# Patient Record
Sex: Male | Born: 1990 | Hispanic: No | Marital: Single | Smoking: Never smoker
Health system: Southern US, Community
[De-identification: ages and names within clinical notes are randomized; demographics above are authoritative.]

---
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
## 2010-12-02 NOTE — H&P (Signed)
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  The patient  allegedly made threats at school to cut the throat, blow up the home,  and kill the animals of a male peer named Mike Juarez who called him  stupid.  The victim has a 50-C order of protection and the school is  considering charges while the patient states very little of this  actually happened, and he does not know why he is at the hospital.  The  patient was placed on a petition by Dr. Jacklyn Shell.  The  patient also reported suicidal ideation being considered dangerous to  himself as well.   HISTORY OF PRESENT ILLNESS:  The patient is under the outpatient care of  Dr. Dolores Frame in [facility] for outpatient psychiatric care.  However, family indicates Dr. Omelia Blackwater is out of network and seem to  suggest that they do not see him very often.  The patient indicates he  has no therapy and never has.  The patient was considered significantly  depressed with crying at [facility], though he was referred as having  mood disorder NOS being treated with Abilify 10 mg daily, Klonopin 0.5  mg daily, and Concerta 36 mg daily by Dr. Omelia Blackwater.  The patient reports  that he had been noncompliant with his medication, but restarted 2 weeks  ago.  The patient formulates that his symptoms do not  absolutely require  the medication, although he states the Concerta does help him  concentrate more.  However, he states he can concentrate adequately  without it.  He states he does sleep in class because he is bored,  though he cannot stay awake when they need to take notes.  The patient  suggests that sleeping in class bothers the cheerleader Mike Juarez as the  main reason for her complaints about him which she states are unfair.  He reports that the cheerleader told others that he wears spikes and all  black, has lacerations on his wrist, and is going to cut her throat and  drink her blood.  The patient cries at times and denies at others.  Mother considers him substantially depressed and states he is always  bringing the family down by his dissatisfaction and disappointment with  life in general, but also family life.  The patient has also reported  suicidal ideation dangerous to himself.  [facility]  indicates that the patient's threats were verified by teachers and peers  even though the patient denies making them.  The patient indicates that  he worries about things over and over though his rumination seems to be  more depressive than anxiety.  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  The patient's  anxiety seems more an explanation for his maladaptive and depressive  behaviors rather than vice versa.  The patient does not appear to have  as much primary anxiety historically, though he does have Klonopin 0.5  mg every morning.  The patient denies organic central nervous system  trauma.  He does not acknowledge hallucinations now or in the past.  He  seems hypersensitive to the comments or actions of others with impulse  control difficulties.  He acknowledges that his mood and anger problems  as well as his anxiety or like those of his biological father who has  recently had a second divorce and is having much  difficulty.  The  patient seems to identify with and share father's symptoms.  I cannot  determine that he sees father very often.  The patient concludes that he  will does have to transfer to [employer] now.  The patient is not  practical or good judgment as he makes such plans.  He has no other  mental health care and denies any primary care.  He uses no alcohol or  illicit drugs except that he smokes one fourth of pack per day of  cigarettes for 2 years.  He denies other alcohol or illicit drugs.  The  school has noted that mother picks ujp the  patient any time he has an  anger outburst, and the patient hangs out with a community residence  where variant behaviors and identities such as substance abuse may  become instilled in the patient.   PAST MEDICAL HISTORY:  The patient has scars on his chest and his left  upper extremity as well as tattoos.  He has a paint ball contusion with  ecchymosis on the abdomen.  He has a history of ventilation tubes  recurrent otitis media on four occasions in the past.  He has contact  lenses.  He had an elbow fracture at age 20.  He had sutures in his left  ankle at age 20.  He reports being sexually active.  He reports being  allergic or sensitive to the Lexapro, eggs and tea manifest by dizziness  and nausea.  He has had no seizure or syncope.  He has had no bulimic  purging or diarrhea.  He has had no heart murmur or arrhythmia.   REVIEW OF SYSTEMS:  The patient denies difficulty with gait, gaze or  continence.  He denies exposure to communicable disease or toxins.  He  denies rash, jaundice or purpura.  There is no headache or memory loss  currently.  There is no sensory loss or coordination deficit.  There is  no cough, dyspnea, tachypnea or wheeze.  There is no chest pain,  palpitations or presyncope.  There is no abdominal pain, nausea,  vomiting or diarrhea.  There is no dysuria or arthralgia.   IMMUNIZATIONS:  Up-to-date.    FAMILY HISTORY:  The patient resides with mother and stepfather who have  been together since the patient was 20 years of age.  Biological parents  divorced when the patient was 66 years of age.  The patient considers  stepfather helpful and appropriate.  The patient is only child in the  home.  He reports having step siblings in [place].  Father  resides in [place], and the patient describes father as having  worry, mood problems and easy anger.  Mother reports that father was  inpatient at age 55.  The patient reports father is undergoing a second  divorce.  Maternal aunt has bipolar disorder.  Paternal aunt, paternal  grandfather and two paternal uncles have ADD.  Cousins and paternal  uncles have substance abuse with cannabis.   SOCIAL DEVELOPMENTAL HISTORY:  The patient is a twelfth grade student at  [employer].  Mother reports grades were the worst ever  last school year.  The patient reports that Concerta helps him  concentrate, but that he can function just as well without it if he  tries.  The patient reportedly currently has a B's and C's for grades.  However he states he sleeps in class unless he needs to take note some  that he stays awake.  He thinks that his sleeping in class may possibly  upset the other students.  Apparently, there is one other potential  victim of his threats in the class besides a Landscape architect.  The  patient states that Willette Pa him of having spikes and all black  clothing, having cut his wrists, and having told her he would drink her  blood by cutting her neck.  The patient has had death charges that were  dropped in the past.  He wants to be a Network engineer.  He now wants to  transfer to [employer].  He had a restaurant job for  2 years that he quit reporting that he was having panic at work.  The  patient is worried that he may have some legal charges either from the  school or as a  consequence of the 50-C order of protection that the  victim obtained.   ASSETS:  The patient states that he can take notes and learn in school  and does want to graduate.   MENTAL STATUS EXAM:  Height is 71 inches and weight is 145 pounds.  Blood pressure is 131/84 with heart rate of 60 sitting and 136/77 with  heart rate of 58 standing.  He is right-handed.  The patient is alert  and oriented with speech intact.  Cranial nerves II-XII are intact.  Muscle strengths and tone are normal.  There are no pathologic reflexes  or soft neurologic findings.  There are no abnormal involuntary  movements.  Gait and gaze are intact.  The patient is defensive  particularly for his actions at school the day of admission.  The  patient maintains that he was falsely accused and that he should not be  at the hospital.  He denies that anything happened, but can formulate  what he thinks Mike Juarez said happened.  The patient did not act upon his  threats otherwise and the family reports that he does get angry in the  past but does not act on the threats.  The patient seems to identify  with biological father somewhat.  The patient had stopped his medicine  until 2 weeks ago and restarted but still has decompensated.  He has  transitional and developmental stressors nearly finished with high  school.  The patient tends to ruminate more than he seems to be over  anxious or obsessive-compulsive.  His sleeping in class does not appear  to be anxiety mediated.  He acknowledges boredom and atypical depressive  features and mother seems to reiterate the depressive features.  There  is no psychosis or mania evident.  He has suicidal ideation attached to  his more substantial homicide threats.   IMPRESSION:  AXIS I:  1. Mood disorder not otherwise specified, most consistent with      atypical major depression.  2. Oppositional defiant disorder.  3. 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  4. Other interpersonal problem.  5. Parent child problem.  6. Other specified family circumstances  7. Noncompliance with treatment.  AXIS II:  Diagnosis deferred.  AXIS III:  1. Sensitive to Lexapro, eggs and tea, manifested by dizziness and      nausea.  2. Contact lenses.  3. Contusion abdomen from paint ball.  4. Cigarette smoking.  AXIS IV:  Stressors family moderate to severe acute and chronic; school  extreme acute and chronic; phase of life moderate acute and chronic.  AXIS V:  GAF on admission 35 with highest in last year 68.   PLAN:  The patient is admitted for inpatient adolescent psychiatric and  multidisciplinary multimodal behavioral treatment in a team-based  programmatic locked psychiatric unit.  Abilify pharmacotherapy will be  continued at 10 mg every morning and can be titrated up if indicated.  Will hold Concerta at this time until mood and anger are definitely  stabilized, and the need is documented to resume Concerta.  Klonopin  will be discontinued.  Apparently Lexapro was prescribed in the past but  was not tolerated.  Cognitive behavioral therapy, anger management,  interpersonal therapy, social communication skill training, problem-  solving and coping skill training, habit reversal, social learning  strategies, family therapy, and empathy training therapies can be  undertaken.  Estimated length stay is 5-7 days with target symptoms  discharge being stabilization of suicide risk and mood, stabilization of  homicide risk and dangerous disruptive behavior, and generalization of  the capacity for safe effect participation in subsequent outpatient  treatment in school and the community.      Lalla Brothers, MD  Electronically Signed     GEJ/MEDQ  D:  05/30/2008  T:  05/30/2008  Job:  [id number]

## 2010-12-02 NOTE — Discharge Summary (Signed)
Mike Juarez, Mike Juarez              ACCOUNT NO.:  [id number]   MEDICAL RECORD NO.:  [id number]          PATIENT TYPE:  INP   LOCATION:  0201                          FACILITY:  BH   PHYSICIAN:  Lalla Brothers, MDDATE OF BIRTH:  23-Sep-1990   DATE OF ADMISSION:  05/29/2008  DATE OF DISCHARGE:  06/05/2008                               DISCHARGE SUMMARY   IDENTIFICATION:  23-1/21-year-old male 12th grade student at [employer], though he still has some eleventh grade classes,  is admitted emergently involuntarily on an [facility] petition for  commitment upon transfer from [facility] for inpatient  stabilization and treatment of homicide and suicide threats, depression  and dangerous disruptive behavior.  The patient allegedly made threats  to a male peer at school when she called him stupid, angrily  threatening to cut her throat, blow up her home and kill her animals.  The family of the girl obtained at 79 -C order of protection and the  patient was detained by Dr. Rubbie Battiest for suicidal ideation.  For full  details please see the typed admission assessment.   SYNOPSIS OF PRESENT ILLNESS:  The patient has apparently had  psychotherapy for one session and declined to continue.  He has been  under the outpatient treatment of Dr. Dolores Frame for the last year  receiving pharmacotherapies including currently Concerta 36 mg every  morning, Abilify 10 mg every morning, Klonopin 0.5 mg daily and  clonidine 0.1 mg nightly if needed.  The patient restarted medications 2  weeks ago after mother had allowed him to discontinue medications after  the last school year ended.  The patient's despair and distress have  again built up such that they fear he will start falling behind in  school.  He significantly failed last school year but has done much  better this school year academically.  His ADHD appears to have even  more social than academic  disorganization.  Although the patient has  denial on arrival stating he does not know why he is hospitalized and  did not do anything wrong, he offers no negative comments or retaliation  for the girl who has obtained an order of protection.  In fact he and  the family are shocked that such order of protection was filed and do  not understand the extent of retaliation by the girl or her family.  The  patient states he had ridden the school bus with this girl in the past  and that he feels she probably is angry at him for falling asleep in  class.  The patient states he sleeps when he is not taking notes as he  gets bored.  However, he is doing much better in school than last year.  They report that he was sensitive to Lexapro prescribed by Dr. Omelia Blackwater.  He is sensitive to eggs and tea manifested by dizziness and nausea.  The  patient may be stressed by father going through a second marital  separation imminently.  The patient lives with mother and stepfather  since parental divorce when the patient  was age 63.  The family had to  move a lot.  Maternal great-grandmother died when the patient was age  20.  Stepfather has been in the family since the patient was age 5.  Mother has been over determined in establishing support for the patient  such that he has not effected good anger management or social problem-  solving.  Father was an inpatient for anxiety and depression when father  was age 75.  Maternal aunt has bipolar depression.  Paternal  grandfather, paternal aunt, and two paternal uncles have ADHD.  There is  substance abuse in cousins and paternal uncle.  The patient had  ventilation tubes on four occasions for recurrent otitis media.  He has  contact lenses.  He is thin.  His girlfriend is at [employer].  School staff are aware that the patient had been kicked out of parents'  house apparently for 2 weeks in the past when he was not following the  family rules.  The  patient apparently stayed in the community at the  home of some notorious citizens that but did not establish substance  abuse or other criminal behavior himself.  Patient has verbal anger but  has never been physically violent to others.  His relations with father  are not as good as they should be according to mother.  The patient  seems worried about father currently.  The patient had employment at a  restaurant apparently for 2 years and had discontinued employment by  reporting to mother that he had panic anxiety there.  The patient can  now state that he discontinued employment so he could apply himself  better to school and mother states the restaurant wishes for the patient  to return to work there when his grades allow.   INITIAL MENTAL STATUS EXAM:  The patient is right-handed with intact  neurological exam.  His phenotypic appearance is somewhat eccentric.  He  has transitional and developmental stressors, becoming easily  frustrated.  He seems to identify with biological father including in  the course of his life.  The patient denies side effects from restarting  his medications 2 weeks ago.  He does not manifest significant anxiety  though he does become stressed seemingly associated with chronic  dissatisfaction and disappointment in himself, his life and his future.  He tends to ruminate over failures at times but at other times is in  denial.  He has boredom and other atypical depressive features.  He has  no psychosis or mania.  Suicide ideation seemed linked to his homicide  threats which quickly dissipated even prior to admission so that he was  not homicidal by the time of admission.   LABORATORY FINDINGS:  CBC is normal with white count 5700, hemoglobin  15.3, MCV of 92 and platelet count 204,000.  Basic metabolic panel was  normal with sodium 139, potassium 3.9, fasting glucose 93, creatinine  0.9, calcium 9.1.  Hepatic function panel was normal with total   bilirubin 1.1, albumin 4.1, AST 19 and ALT 13 with GGT 16.  Free T4 was  normal at 1.37 and TSH of 1.303.  10-hour fasting lipid profile revealed  HDL cholesterol low at 27 mg/dL with normal greater than 34, otherwise  normal with total cholesterol 105, LDL 61, and VLDL 17 and triglyceride  83.  Hemoglobin A1c was normal at 4.6% with reference range 4.6-6.1.  Urinalysis was concentrated specimen with specific gravity of 1.037 on  admission and pH 6.  RPR was nonreactive.  Urine drug screen was  negative with creatinine of 398 mg/dL documenting adequate specimen.  Urine probe for gonorrhea and chlamydia by DNA amplification were both  negative.   HOSPITAL COURSE AND TREATMENT:  General medical exam by Jorje Guild, PA-C  noted the elbow fracture at age 30 and PE tubes on four occasions.  He  has contact lenses.  He has thin habitus.  Vital signs were normal  throughout hospital stay with maximum temperature 98.1.  His height was  180 cm and weight was 65.8 kg on admission and 64 kg on discharge.  Initial supine blood pressure was 92/41 with heart rate of 48 and  standing blood pressure 98/64 with heart rate of 104.  At the time of  discharge, supine blood pressure was 98/58 with heart rate of 59 and  standing blood pressure 101/66 with heart rate of 104.  His supine heart  rate varied from 48-64 during the hospital stay.  The patient's  medications were reduced to Abilify 10 mg every morning only for the  first several hospital days.  Concerta was restarted at 36 mg every  morning and then Abilify increased to 15 mg every morning.  Klonopin and  clonidine were not restarted.  The patient did not manifest anxiety  during the hospital stay.  He did manifest some chronic depression that  began to respond as he engaged in milieu and group therapies.  He then  became active in family therapy.  By the time of discharge he had a  successful case conference with both biological parents, stepfather  and  school principal.  They addressed the options of [employer]  alternative school, [address], homebound, or completion of  [employer].  They determined that the patient has rights to school  as well though mother did attend the court proceedings regarding  restraining order and they are obtaining a lawyer as well as  honestly  addressing the devaluation of the patient by the other families.  The  patient made quick and appropriate modifications in his denial to face  work on the stressors of returning to [employer] which he  initially concluded was too stressful and a bad idea.  Enabling was  worked through during the hospital stay, the family becoming more  capable and the patient, becoming more self-directed in his appropriate  behavior.  The patient manifested no violence either cognitively or by  behavior during the hospital stay.  Oppositional defiance is reasonably  mild and ADHD is his primary diagnosis contributing to the development  of dysthymic disorder over time.  He required no seclusion or restraint  during hospital stay.   FINAL DIAGNOSES:  Axis I:  1. Dysthymic disorder, early onset, severe with atypical features.  2. Attention deficit hyperactivity disorder combined subtype, severe.  3. Oppositional defiant disorder.  4. Other interpersonal problem.  5. Parent child problem.  6. Other specified family circumstances.  7. Noncompliance with treatment.  Axis II:  Diagnosis deferred.  Axis III:  1. Sensitive to Lexapro, eggs, and tea manifest by dizziness and      nausea.  2. Contact lenses.  3. Abdominal wall contusion from paint ball.  4. Cigarette smoking.  5. Low HDL cholesterol of 27.  Axis IV:  Stressors family moderate, acute and chronic; school extreme,  acute and chronic; phase of life moderate, acute and chronic; legal  moderate, acute.  Axis V:  GAF on admission 35 with highest in last year  68 and discharge  GAF was 54.   PLAN:   The patient was discharged to parents in improved condition free  of suicidal ideation.  He and school established a return to school plan  where the patient would not have any contact with the two girls who want  him out of their school and out of [place].  Makya is  psychiatrically safe and capable to attend [employer].  The patient will have supporting containment established with the school  and the family with a plan to remove him from school should he have any  single violation of this containment plan.  Mother asked about  medication such as Klonopin and clonidine and is advised that the  patient must be totally alert and responsible for all of his behavior,  advising against these two medications  therefore.  He follows a regular  diet has no restrictions on physical activity.  He will have increased  exercise for low HDL cholesterol.  He requires no wound care or pain  management.  Crisis and safety plans are outlined if needed.   He is discharged on the following medication:  1. Concerta 36 mg every morning quantity #30 with no refill      prescribed.  2. Abilify 15 mg every morning quantity #30 with no refill, though he      has remaining supply at home of Concerta and Abilify 10 mg tablets,      being able use 1-1/2 of the 10 mg Abilify in the morning.  The      school agreed to his medication administration at school on school      days and paperwork was completed for such.  His clonidine and      clonazepam were discontinued.   AFTERCARE:  Will include therapy with Eyvonne Mechanic at [facility] [phone] with appointment on June 13, 2008 at  14:30.  He will see Dr. Dolores Frame July 10, 2008 at 16:00 at [phone]  [phone].      Lalla Brothers, MD  Electronically Signed     GEJ/MEDQ  D:  06/05/2008  T:  06/05/2008  Job:  [id number]   cc:   Dolores Frame, Dr.  [address]  [place], [place] [address]   Eyvonne Mechanic,  PhD  [facility]  [address]  [place], [place] [address]  [phone]   Tedra Senegal  [address]  [place], [place] [address]

## 2011-04-21 LAB — DRUGS OF ABUSE SCREEN W/O ALC, ROUTINE URINE
Amphetamine Screen, Ur: NEGATIVE
Barbiturate Quant, Ur: NEGATIVE
Benzodiazepines.: NEGATIVE
Cocaine Metabolites: NEGATIVE
Creatinine,U: 397.7
Marijuana Metabolite: NEGATIVE
Methadone: NEGATIVE
Opiate Screen, Urine: NEGATIVE
Phencyclidine (PCP): NEGATIVE
Propoxyphene: NEGATIVE

## 2011-04-21 LAB — CBC
HCT: 44.2
Hemoglobin: 15.3
MCHC: 34.7
MCV: 92.3
Platelets: 204
RBC: 4.79
RDW: 12.6
WBC: 5.7

## 2011-04-21 LAB — BASIC METABOLIC PANEL
CO2: 24
Chloride: 107
Creatinine, Ser: 0.9
GFR calc Af Amer: >60
Potassium: 3.9
Sodium: 139

## 2011-04-21 LAB — HEPATIC FUNCTION PANEL
ALT: 13
AST: 19
Indirect Bilirubin: 0.9
Total Bilirubin: 1.1

## 2011-04-21 LAB — DIFFERENTIAL
Basophils Relative: 0
Eosinophils Absolute: 0.1
Lymphs Abs: 1.8
Monocytes Absolute: 0.6
Monocytes Relative: 10
Neutrophils Relative %: 56

## 2011-04-21 LAB — URINALYSIS, ROUTINE W REFLEX MICROSCOPIC
Bilirubin Urine: NEGATIVE
Glucose, UA: NEGATIVE
Hgb urine dipstick: NEGATIVE
Ketones, ur: NEGATIVE
Nitrite: NEGATIVE
Specific Gravity, Urine: 1.037 — ABNORMAL HIGH
pH: 6

## 2011-04-21 LAB — GC/CHLAMYDIA PROBE AMP, URINE: Chlamydia, Swab/Urine, PCR: NEGATIVE

## 2011-04-21 LAB — LIPID PANEL
Cholesterol: 105
Total CHOL/HDL Ratio: 3.9

## 2011-04-21 LAB — RPR: RPR Ser Ql: NONREACTIVE

## 2011-04-21 LAB — TSH: TSH: 1.303

## 2011-04-21 LAB — GAMMA GT: GGT: 16

## 2011-10-02 ENCOUNTER — Emergency Department: Payer: Self-pay | Admitting: Emergency Medicine

## 2012-02-11 ENCOUNTER — Emergency Department: Payer: Self-pay | Admitting: Emergency Medicine

## 2012-02-11 LAB — DRUG SCREEN, URINE
Amphetamines, Ur Screen: NEGATIVE (ref ?–1000)
Cocaine Metabolite,Ur ~~LOC~~: NEGATIVE (ref ?–300)
MDMA (Ecstasy)Ur Screen: NEGATIVE (ref ?–500)
Methadone, Ur Screen: NEGATIVE (ref ?–300)
Opiate, Ur Screen: NEGATIVE (ref ?–300)
Phencyclidine (PCP) Ur S: NEGATIVE (ref ?–25)
Tricyclic, Ur Screen: NEGATIVE (ref ?–1000)

## 2012-02-11 LAB — URINALYSIS, COMPLETE
Ph: 5 (ref 4.5–8.0)
RBC,UR: 5 /HPF (ref 0–5)
Squamous Epithelial: <1

## 2012-02-11 LAB — COMPREHENSIVE METABOLIC PANEL
Albumin: 5.4 g/dL — ABNORMAL HIGH (ref 3.4–5.0)
Alkaline Phosphatase: 94 U/L (ref 50–136)
Anion Gap: 16 (ref 7–16)
BUN: 24 mg/dL — ABNORMAL HIGH (ref 7–18)
Glucose: 92 mg/dL (ref 65–99)
Potassium: 3.3 mmol/L — ABNORMAL LOW (ref 3.5–5.1)
SGOT(AST): 32 U/L (ref 15–37)
Sodium: 139 mmol/L (ref 136–145)
Total Protein: 9.9 g/dL — ABNORMAL HIGH (ref 6.4–8.2)

## 2012-02-11 LAB — CBC
MCHC: 36.2 g/dL — ABNORMAL HIGH (ref 32.0–36.0)
Platelet: 372 10*3/uL (ref 150–440)
RDW: 14 % (ref 11.5–14.5)
WBC: 12.1 10*3/uL — ABNORMAL HIGH (ref 3.8–10.6)

## 2012-02-11 LAB — PHOSPHORUS: Phosphorus: 3.9 mg/dL (ref 2.5–4.9)

## 2012-06-30 ENCOUNTER — Emergency Department: Payer: Self-pay | Admitting: Emergency Medicine

## 2013-06-06 IMAGING — CR RIGHT HAND - COMPLETE 3+ VIEW
1 series · 3 of 3 positions shown · non-contrast
Comparison: none

REASON FOR EXAM: injury
COMMENTS:   May transport without cardiac monitor

PROCEDURE:     DXR - DXR HAND RT COMPLETE W/OBLIQUES  - June 30, 2012  [DATE]
RESULT:     No acute bony or joint abnormality.

[Series 1: pa · 0.17mm/px · 3 of 3 slices shown]
[im 1/3]
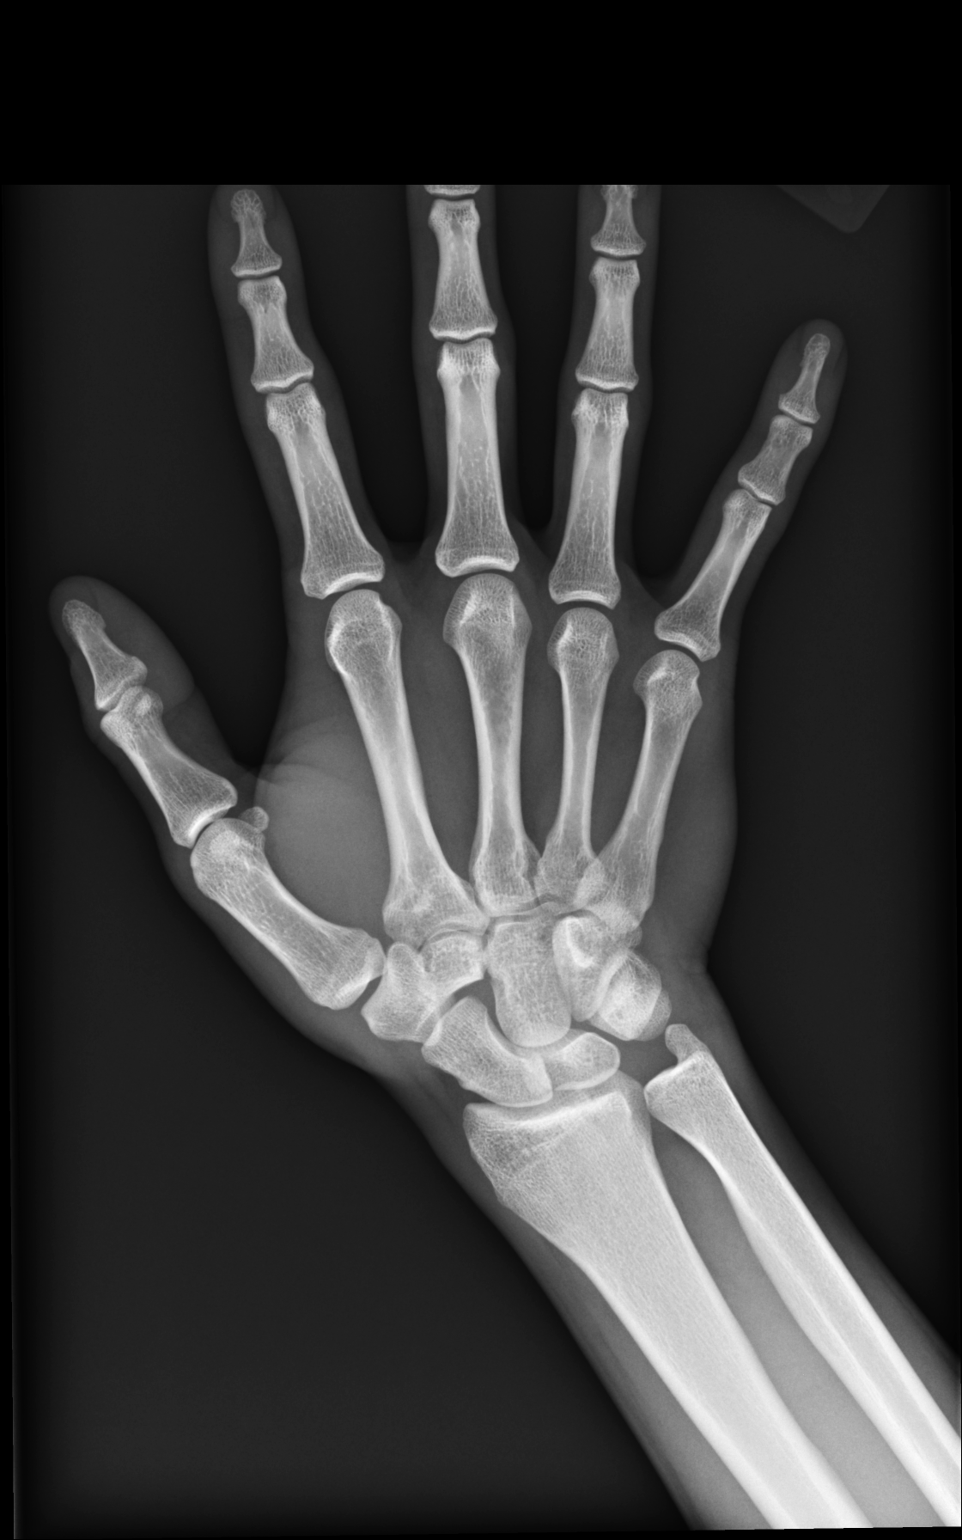
[im 2/3]
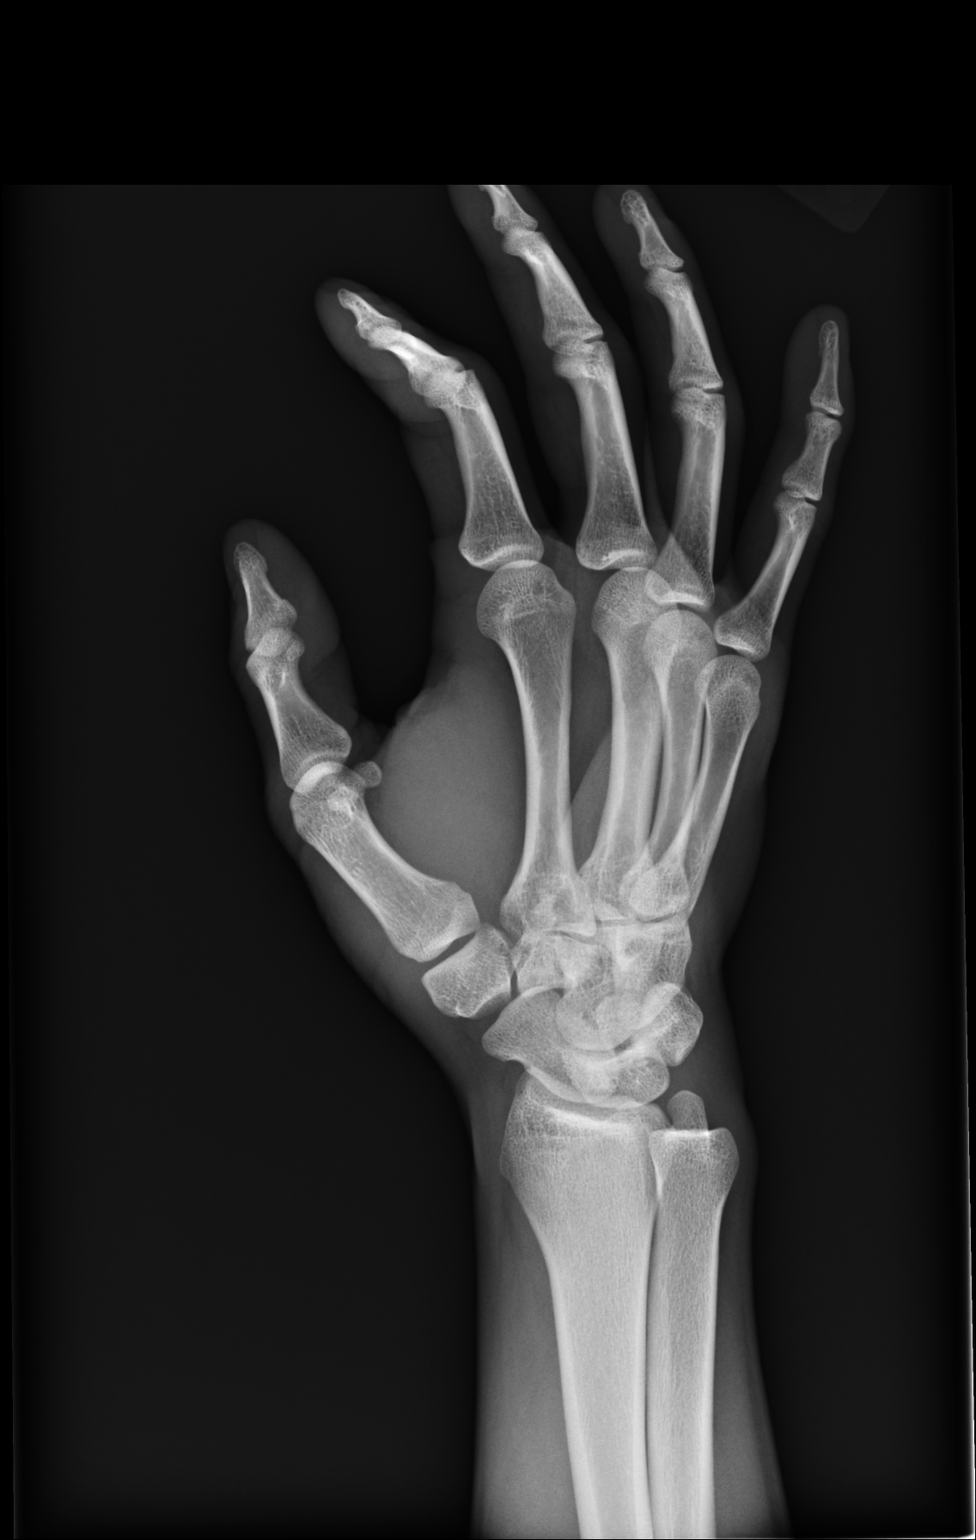
[im 3/3]
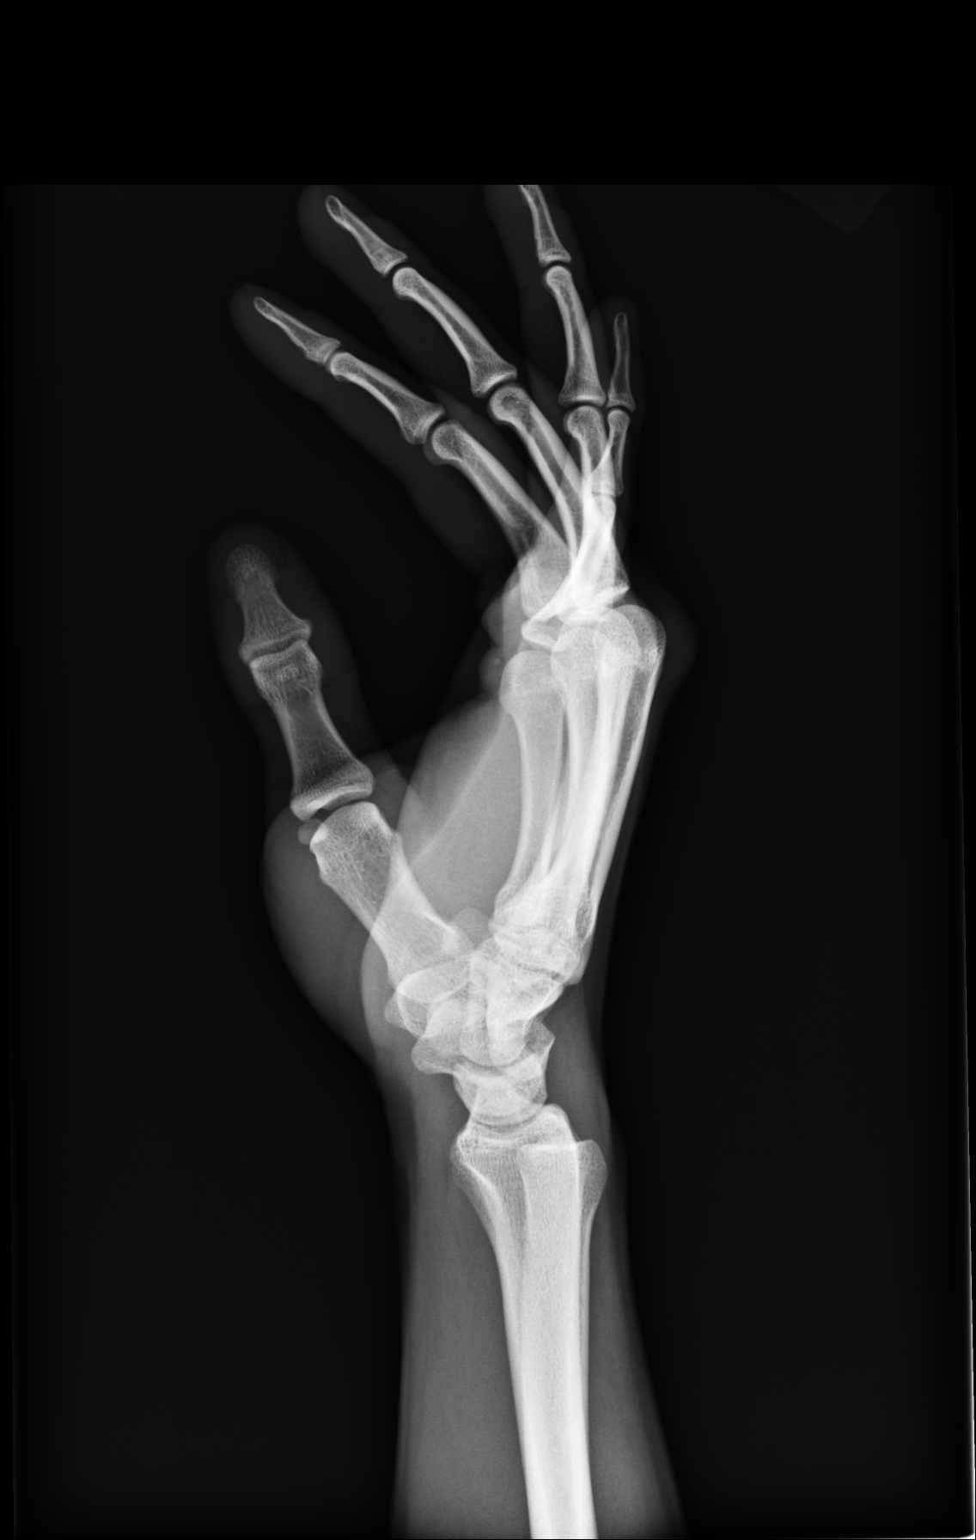

[3 of 3 positions shown; findings below may reference images not displayed]

IMPRESSION: No acute abnormality.

## 2017-05-24 ENCOUNTER — Encounter: Payer: Self-pay | Admitting: Emergency Medicine

## 2017-05-24 ENCOUNTER — Emergency Department
Admission: EM | Admit: 2017-05-24 | Discharge: 2017-05-24 | Disposition: A | Discharge status: Home / Self Care | Payer: Medicaid Other | Attending: Emergency Medicine | Admitting: Emergency Medicine

## 2017-05-24 ENCOUNTER — Emergency Department: Payer: Medicaid Other

## 2017-05-24 DIAGNOSIS — J069 Acute upper respiratory infection, unspecified: Secondary | ICD-10-CM | POA: Diagnosis not present

## 2017-05-24 DIAGNOSIS — B9789 Other viral agents as the cause of diseases classified elsewhere: Secondary | ICD-10-CM

## 2017-05-24 DIAGNOSIS — R05 Cough: Secondary | ICD-10-CM | POA: Diagnosis present

## 2017-05-24 DIAGNOSIS — J40 Bronchitis, not specified as acute or chronic: Secondary | ICD-10-CM | POA: Diagnosis not present

## 2017-05-24 LAB — INFLUENZA PANEL BY PCR (TYPE A & B)
INFLAPCR: NEGATIVE
Influenza B By PCR: NEGATIVE

## 2017-05-24 LAB — POCT RAPID STREP A: STREPTOCOCCUS, GROUP A SCREEN (DIRECT): NEGATIVE

## 2017-05-24 MED ORDER — PREDNISONE 10 MG (21) PO TBPK: ORAL_TABLET | ORAL | 0 refills | Status: DC

## 2017-05-24 MED ORDER — FLUTICASONE PROPIONATE 50 MCG/ACT NA SUSP: 2.0000 | Freq: Every day | NASAL | 0 refills | Status: DC

## 2017-05-24 MED ORDER — PREDNISONE 20 MG PO TABS
60.0000 mg | ORAL_TABLET | Freq: Once | ORAL | Status: AC
  Administered 2017-05-24: 60 mg via ORAL
  Filled 2017-05-24: qty 3

## 2017-05-24 MED ORDER — BENZONATATE 100 MG PO CAPS: 100.0000 mg | ORAL_CAPSULE | Freq: Three times a day (TID) | ORAL | 0 refills | Status: DC | PRN

## 2017-05-24 NOTE — ED Triage Notes (Signed)
Pt reports developed fever yesterday, pt reports sore throat and generalized body malaise. Pt talks in complete sentences, reports took ibuprofen and vitamin C at 1700. Pt reports nausea but no episodes of vomiting.

## 2017-05-24 NOTE — ED Notes (Signed)
Pt c/o general feeling of weakness with sore throat starting yesterday

## 2017-05-24 NOTE — Discharge Instructions (Signed)
Your exam, x-ray, and labs are consistent with a viral URI. You strep and flu tests were negative. Take the prescription meds as directed. Follow-up with your provider or [facility] as needed. Continue OTC Tylenol and Motrin as well as a daily allergy medicine.

## 2017-05-24 NOTE — ED Provider Notes (Signed)
[facility] Emergency Department Provider Note ____________________________________________  Time seen: 2146  I have reviewed the triage vital signs and the nursing notes.  HISTORY  Chief Complaint  Fever; Cough; and Sore Throat  HPI Mike Juarez is a 26 y.o. male presents to the ED for evaluation of "flu-like" symptoms. He reports intermittent fevers, bodyaches, and sore throat since yesterday. He reports similar symptoms in his father and step-father. He has dosed ibuprofen for fevers. He reports nausea w/o vomiting.   History reviewed. No pertinent past medical history.  There are no active problems to display for this patient.  History reviewed. No pertinent surgical history.  Prior to Admission medications   Medication Sig Start Date End Date Taking? Authorizing Provider  benzonatate (TESSALON PERLES) 100 MG capsule Take 1 capsule (100 mg total) 3 (three) times daily as needed by mouth for cough (Take 1-2 per dose). 05/24/17   Raidyn Wassink, Charlesetta IvoryJenise V Bacon, PA-C  fluticasone (FLONASE) 50 MCG/ACT nasal spray Place 2 sprays daily into both nostrils. 05/24/17   Kylle Lall, Charlesetta IvoryJenise V Bacon, PA-C  predniSONE (STERAPRED UNI-PAK 21 TAB) 10 MG (21) TBPK tablet 6-day taper as directed. 05/24/17   Cadon Raczka, Charlesetta IvoryJenise V Bacon, PA-C    Allergies Patient has no allergy information on record.  No family history on file.  Social History Social History   Tobacco Use  . Smoking status: Never Smoker  . Smokeless tobacco: Never Used  Substance Use Topics  . Alcohol use: Yes    Comment: occacional   . Drug use: No    Review of Systems  Constitutional: Positive for fever. Eyes: Negative for visual changes. ENT: Positive for sore throat. Cardiovascular: Negative for chest pain. Respiratory: Negative for shortness of breath. Gastrointestinal: Negative for abdominal pain, vomiting and diarrhea. Genitourinary: Negative for dysuria. Musculoskeletal: Negative for back pain.  Generalized myalgias. Skin: Negative for rash. Neurological: Negative for headaches, focal weakness or numbness. ____________________________________________  PHYSICAL EXAM:  VITAL SIGNS: ED Triage Vitals  Enc Vitals Group     BP 05/24/17 1935 104/60     Pulse Rate 05/24/17 1935 67     Resp 05/24/17 1935 20     Temp 05/24/17 1935 99.7 F (37.6 C)     Temp Source 05/24/17 1935 Oral     SpO2 05/24/17 1935 98 %     Weight 05/24/17 1935 180 lb (81.6 kg)     Height 05/24/17 1935 6\' 2"  (1.88 m)     Head Circumference --      Peak Flow --      Pain Score 05/24/17 1934 8     Pain Loc --      Pain Edu? --      Excl. in GC? --     Constitutional: Alert and oriented. Well appearing and in no distress. Head: Normocephalic and atraumatic. Eyes: Conjunctivae are normal. PERRL. Normal extraocular movements Ears: Canals clear. TMs with chronic defect on the right. Left TM partially obscured by cerumen.  Nose: No congestion/rhinorrhea/epistaxis. Mouth/Throat: Mucous membranes are moist. Uvula is midline and tonsils are enlarged, erythematous, and mildly exudative.  Neck: Supple. No thyromegaly. Hematological/Lymphatic/Immunological: No cervical lymphadenopathy. Cardiovascular: Normal rate, regular rhythm. Normal distal pulses. Respiratory: Normal respiratory effort. No wheezes/rales/rhonchi. Gastrointestinal: Soft and nontender. No distention. Musculoskeletal: Nontender with normal range of motion in all extremities.  Neurologic:  Normal gait without ataxia. Normal speech and language. No gross focal neurologic deficits are appreciated. Skin:  Skin is warm, dry and intact. No rash noted. Psychiatric:  Mood and affect are normal. Patient exhibits appropriate insight and judgment. ____________________________________________   LABS (pertinent positives/negatives)  Labs Reviewed  INFLUENZA PANEL BY PCR (TYPE A & B)  POCT RAPID STREP A   ____________________________________________    RADIOLOGY  CXR IMPRESSION: Hyperinflation with mild bronchitic changes. No acute consolidation. ____________________________________________  PROCEDURES  Prednisone 60 mg PO ____________________________________________  INITIAL IMPRESSION / ASSESSMENT AND PLAN / ED COURSE  Patient with ED evaluation of sudden onset of fever, chills, cough, and body aches.  His symptoms are consistent with a viral URI.  His chest x-ray shows mild bronchitic changes.  His rapid strep test and influenza test were both negative at this time.  He will be discharged with a prescription for prednisone, Tessalon Perles, and Flonase.  He will does an over-the-counter allergy medicine and decongestant as needed.  He will also continue to monitor and treat fevers with Tylenol or Motrin.  Follow-up with his primary care provider or [place] as needed. ____________________________________________  FINAL CLINICAL IMPRESSION(S) / ED DIAGNOSES  Final diagnoses:  Bronchitis  Viral URI with cough     Lennyx Verdell, Charlesetta Ivory, PA-C 05/24/17 2314    Merrily Brittle, MD 05/24/17 2335

## 2017-05-24 NOTE — ED Notes (Signed)
Strep culture sent.

## 2018-04-03 ENCOUNTER — Other Ambulatory Visit: Payer: Self-pay

## 2018-04-03 ENCOUNTER — Emergency Department: Payer: Medicaid Other

## 2018-04-03 ENCOUNTER — Emergency Department
Admission: EM | Admit: 2018-04-03 | Discharge: 2018-04-03 | Disposition: A | Discharge status: Home / Self Care | Payer: Medicaid Other | Attending: Emergency Medicine | Admitting: Emergency Medicine

## 2018-04-03 ENCOUNTER — Encounter: Payer: Self-pay | Admitting: Intensive Care

## 2018-04-03 DIAGNOSIS — I889 Nonspecific lymphadenitis, unspecified: Secondary | ICD-10-CM | POA: Diagnosis not present

## 2018-04-03 DIAGNOSIS — R1909 Other intra-abdominal and pelvic swelling, mass and lump: Secondary | ICD-10-CM

## 2018-04-03 DIAGNOSIS — Z79899 Other long term (current) drug therapy: Secondary | ICD-10-CM | POA: Diagnosis not present

## 2018-04-03 DIAGNOSIS — R1031 Right lower quadrant pain: Secondary | ICD-10-CM | POA: Diagnosis not present

## 2018-04-03 LAB — URINALYSIS, COMPLETE (UACMP) WITH MICROSCOPIC
Bacteria, UA: NONE SEEN
Bilirubin Urine: NEGATIVE
GLUCOSE, UA: NEGATIVE mg/dL
HGB URINE DIPSTICK: NEGATIVE
Ketones, ur: NEGATIVE mg/dL
Leukocytes, UA: NEGATIVE
NITRITE: NEGATIVE
PROTEIN: NEGATIVE mg/dL
Specific Gravity, Urine: 1.013 (ref 1.005–1.030)
Squamous Epithelial / LPF: NONE SEEN (ref 0–5)
WBC UA: NONE SEEN WBC/hpf (ref 0–5)
pH: 6 (ref 5.0–8.0)

## 2018-04-03 LAB — COMPREHENSIVE METABOLIC PANEL
ALK PHOS: 51 U/L (ref 38–126)
ALT: 16 U/L (ref 0–44)
AST: 20 U/L (ref 15–41)
Albumin: 4.2 g/dL (ref 3.5–5.0)
Anion gap: 7 (ref 5–15)
BUN: 17 mg/dL (ref 6–20)
CALCIUM: 9 mg/dL (ref 8.9–10.3)
CHLORIDE: 102 mmol/L (ref 98–111)
CO2: 28 mmol/L (ref 22–32)
Creatinine, Ser: 1.37 mg/dL — ABNORMAL HIGH (ref 0.61–1.24)
GFR calc Af Amer: >60 mL/min (ref >60)
GLUCOSE: 96 mg/dL (ref 70–99)
Potassium: 3.9 mmol/L (ref 3.5–5.1)
SODIUM: 137 mmol/L (ref 135–145)
TOTAL PROTEIN: 7.2 g/dL (ref 6.5–8.1)
Total Bilirubin: 1 mg/dL (ref 0.3–1.2)

## 2018-04-03 LAB — CBC
HCT: 40.4 % (ref 40.0–52.0)
HEMOGLOBIN: 14.9 g/dL (ref 13.0–18.0)
MCH: 32.9 pg (ref 26.0–34.0)
MCHC: 36.8 g/dL — AB (ref 32.0–36.0)
MCV: 89.5 fL (ref 80.0–100.0)
Platelets: 174 10*3/uL (ref 150–440)
RBC: 4.52 MIL/uL (ref 4.40–5.90)
RDW: 12.8 % (ref 11.5–14.5)
WBC: 10.1 10*3/uL (ref 3.8–10.6)

## 2018-04-03 LAB — RAPID HIV SCREEN (HIV 1/2 AB+AG)
HIV 1/2 ANTIBODIES: NONREACTIVE
HIV-1 P24 ANTIGEN - HIV24: NONREACTIVE

## 2018-04-03 LAB — CHLAMYDIA/NGC RT PCR (ARMC ONLY)
Chlamydia Tr: NOT DETECTED
N gonorrhoeae: NOT DETECTED

## 2018-04-03 LAB — LIPASE, BLOOD: LIPASE: 27 U/L (ref 11–51)

## 2018-04-03 MED ORDER — DOXYCYCLINE HYCLATE 100 MG PO TABS: 100.0000 mg | ORAL_TABLET | Freq: Two times a day (BID) | ORAL | 0 refills | Status: DC

## 2018-04-03 NOTE — ED Triage Notes (Signed)
Patient c/o mass on R groin that started X1 week ago that has gradually gotten bigger. Patient states the mass is hard to touch. Has taken no OTC meds today. Denies urinary symtpoms or testicle swelling. Reports when he coughs he can feel pain in abdomen.

## 2018-04-03 NOTE — ED Provider Notes (Signed)
[facility] Emergency Department Provider Note  Time seen: 3:24 PM  I have reviewed the triage vital signs and the nursing notes.   HISTORY  Chief Complaint Groin Swelling (right side)    HPI Mike Juarez is a [age] y.o. male with no past medical history who presents to the emergency department for a right swollen inguinal lymph node.  According to the patient approximately 1 week ago he developed cold-like symptoms, now has a swollen area to his right groin that is very tender to palpation.  Denies any penile discharge or groin lesions.  Denies any leg pain swelling or infection.  No known bites.  Denies any fever although the patient has a borderline low-grade temperature 99.4 in the emergency department.  States the area is very painful to palpation.   History reviewed. No pertinent past medical history.  There are no active problems to display for this patient.   History reviewed. No pertinent surgical history.  Prior to Admission medications   Medication Sig Start Date End Date Taking? Authorizing Provider  benzonatate (TESSALON PERLES) 100 MG capsule Take 1 capsule (100 mg total) 3 (three) times daily as needed by mouth for cough (Take 1-2 per dose). 05/24/17   Menshew, Charlesetta IvoryJenise V Bacon, PA-C  fluticasone (FLONASE) 50 MCG/ACT nasal spray Place 2 sprays daily into both nostrils. 05/24/17   Menshew, Charlesetta IvoryJenise V Bacon, PA-C  predniSONE (STERAPRED UNI-PAK 21 TAB) 10 MG (21) TBPK tablet 6-day taper as directed. 05/24/17   Menshew, Charlesetta IvoryJenise V Bacon, PA-C    No Known Allergies  History reviewed. No pertinent family history.  Social History Social History   Tobacco Use  . Smoking status: Never Smoker  . Smokeless tobacco: Never Used  Substance Use Topics  . Alcohol use: Yes    Comment: occ  . Drug use: No    Review of Systems Constitutional: Negative for fever Cardiovascular: Negative for chest pain. Respiratory: Negative for shortness of breath.  Did  state cough and cold-like symptoms 1 week ago. Gastrointestinal: Negative for abdominal pain, vomiting. Genitourinary: Negative for urinary compaints Musculoskeletal: Swelling to right groin/leg. Skin: Negative for skin complaints  Neurological: Negative for headache All other ROS negative  ____________________________________________   PHYSICAL EXAM:  VITAL SIGNS: ED Triage Vitals [04/03/18 1338]  Enc Vitals Group     BP 139/74     Pulse Rate 60     Resp 16     Temp 99.4 F (37.4 C)     Temp Source Oral     SpO2 98 %     Weight 176 lb (79.8 kg)     Height 6\' 2"  (1.88 m)     Head Circumference      Peak Flow      Pain Score 6     Pain Loc      Pain Edu?      Excl. in GC?    Constitutional: Alert and oriented. Well appearing and in no distress. Eyes: Normal exam ENT   Head: Normocephalic and atraumatic   Mouth/Throat: Mucous membranes are moist. Cardiovascular: Normal rate, regular rhythm. No murmur Respiratory: Normal respiratory effort without tachypnea nor retractions. Breath sounds are clear  Gastrointestinal: Soft and nontender. No distention.  Normal GU exam.  No lesions identified, no penile discharge, nontender normal-appearing testicles. Musculoskeletal: Lower extremities appear well, no calf tenderness no lower extremity edema.  On the right side the patient does have moderate swelling with moderate tenderness to palpation overlying the right inguinal  lymph nodes.  There is no skin color changes overlying. Neurologic:  Normal speech and language. No gross focal neurologic deficits are appreciated. Skin:  Skin is warm, dry and intact.  Psychiatric: Mood and affect are normal.   ____________________________________________     RADIOLOGY  Ultrasound consistent with right groin adenopathy.  ____________________________________________   INITIAL IMPRESSION / ASSESSMENT AND PLAN / ED COURSE  Pertinent labs & imaging results that were available during  my care of the patient were reviewed by me and considered in my medical decision making (see chart for details).  Patient presents to the emergency department for right groin swelling which is most consistent clinically with inguinal lymphadenopathy.  Ultrasound shows right inguinal lymphadenopathy.  There is no obvious appearance to cellulitis in the area and no erythema.  The area is very tender which would make lymphoma less likely.  Normal GU exam without lesions identified which would make STDs less likely although I did add on a GC chlamydia test to the patient's urinalysis.  Patient did state cold-like symptoms 1 week ago now with his lymphadenopathy added on a rapid HIV test as a precaution.  Patient's blood work and urinalysis thus far largely nonrevealing.  I suspect likely reactive lymphadenopathy.  We will cover with doxycycline twice daily for 10 days.  I discussed with the patient the need to keep a very close eye on this area to make sure it does resolve and to make sure no other areas of his body become affected.  I discussed return precautions as well as PCP follow-up.  ____________________________________________   FINAL CLINICAL IMPRESSION(S) / ED DIAGNOSES  Right inguinal lymphadenopathy    Minna Antis, MD 04/03/18 1529

## 2018-04-03 NOTE — ED Notes (Signed)
EDP at bedside  

## 2018-04-03 NOTE — ED Notes (Signed)
First Nurse Note: Pt c/o lump in groin. Pt is in NAD at this time.

## 2018-04-03 NOTE — ED Notes (Signed)
Pt c/o knot to R groin x 1 week. Pt states worsening over the last week with movement, worse over the last 2 days that swelling has "really gotten bad". Pt states unsure if he had fever.

## 2018-04-30 IMAGING — CR DG CHEST 2V
2 series · 2 of 2 positions shown · non-contrast
Comparison: None.

CLINICAL DATA: Sudden cough and fever

EXAM:
CHEST  2 VIEW

[chest pa]
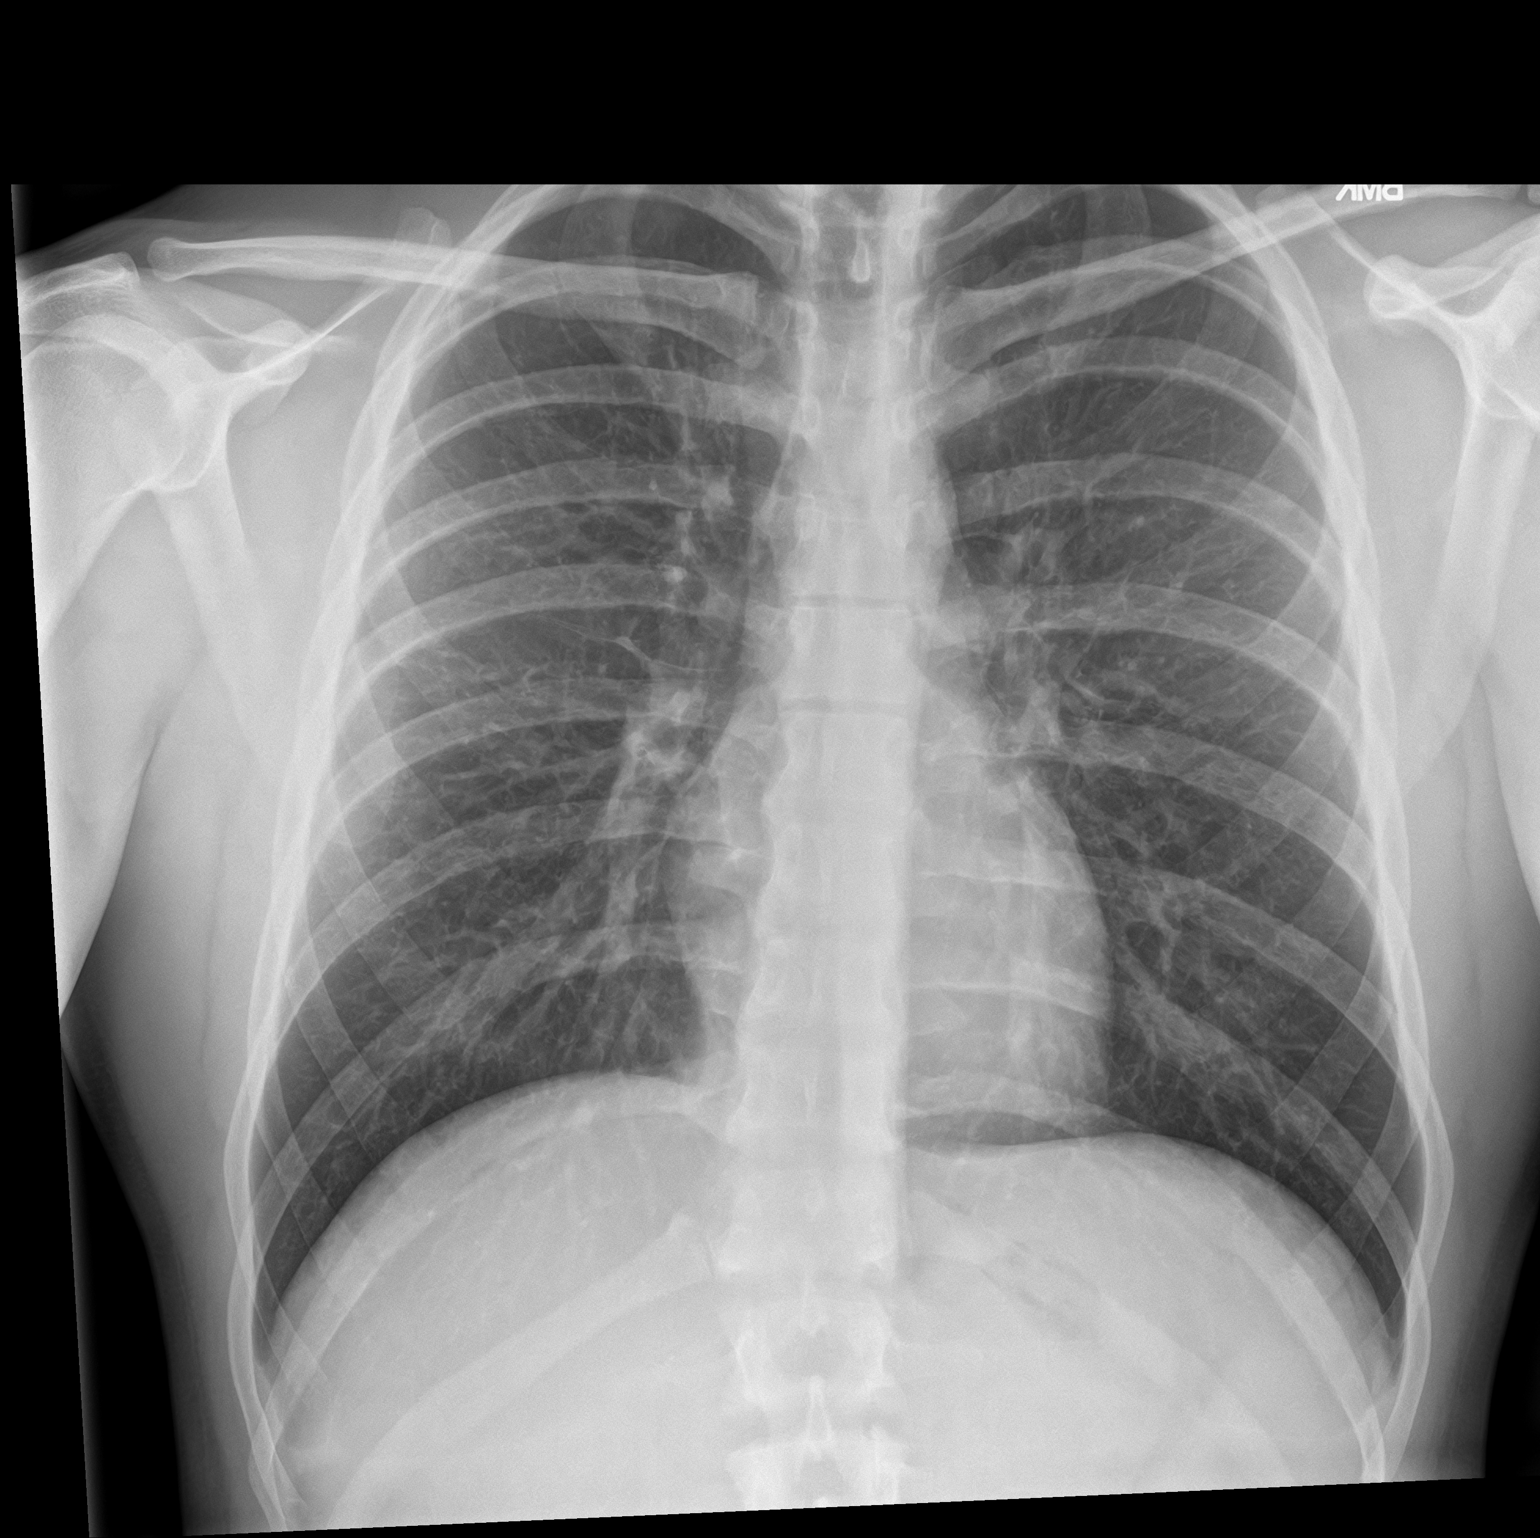

[chest lat]
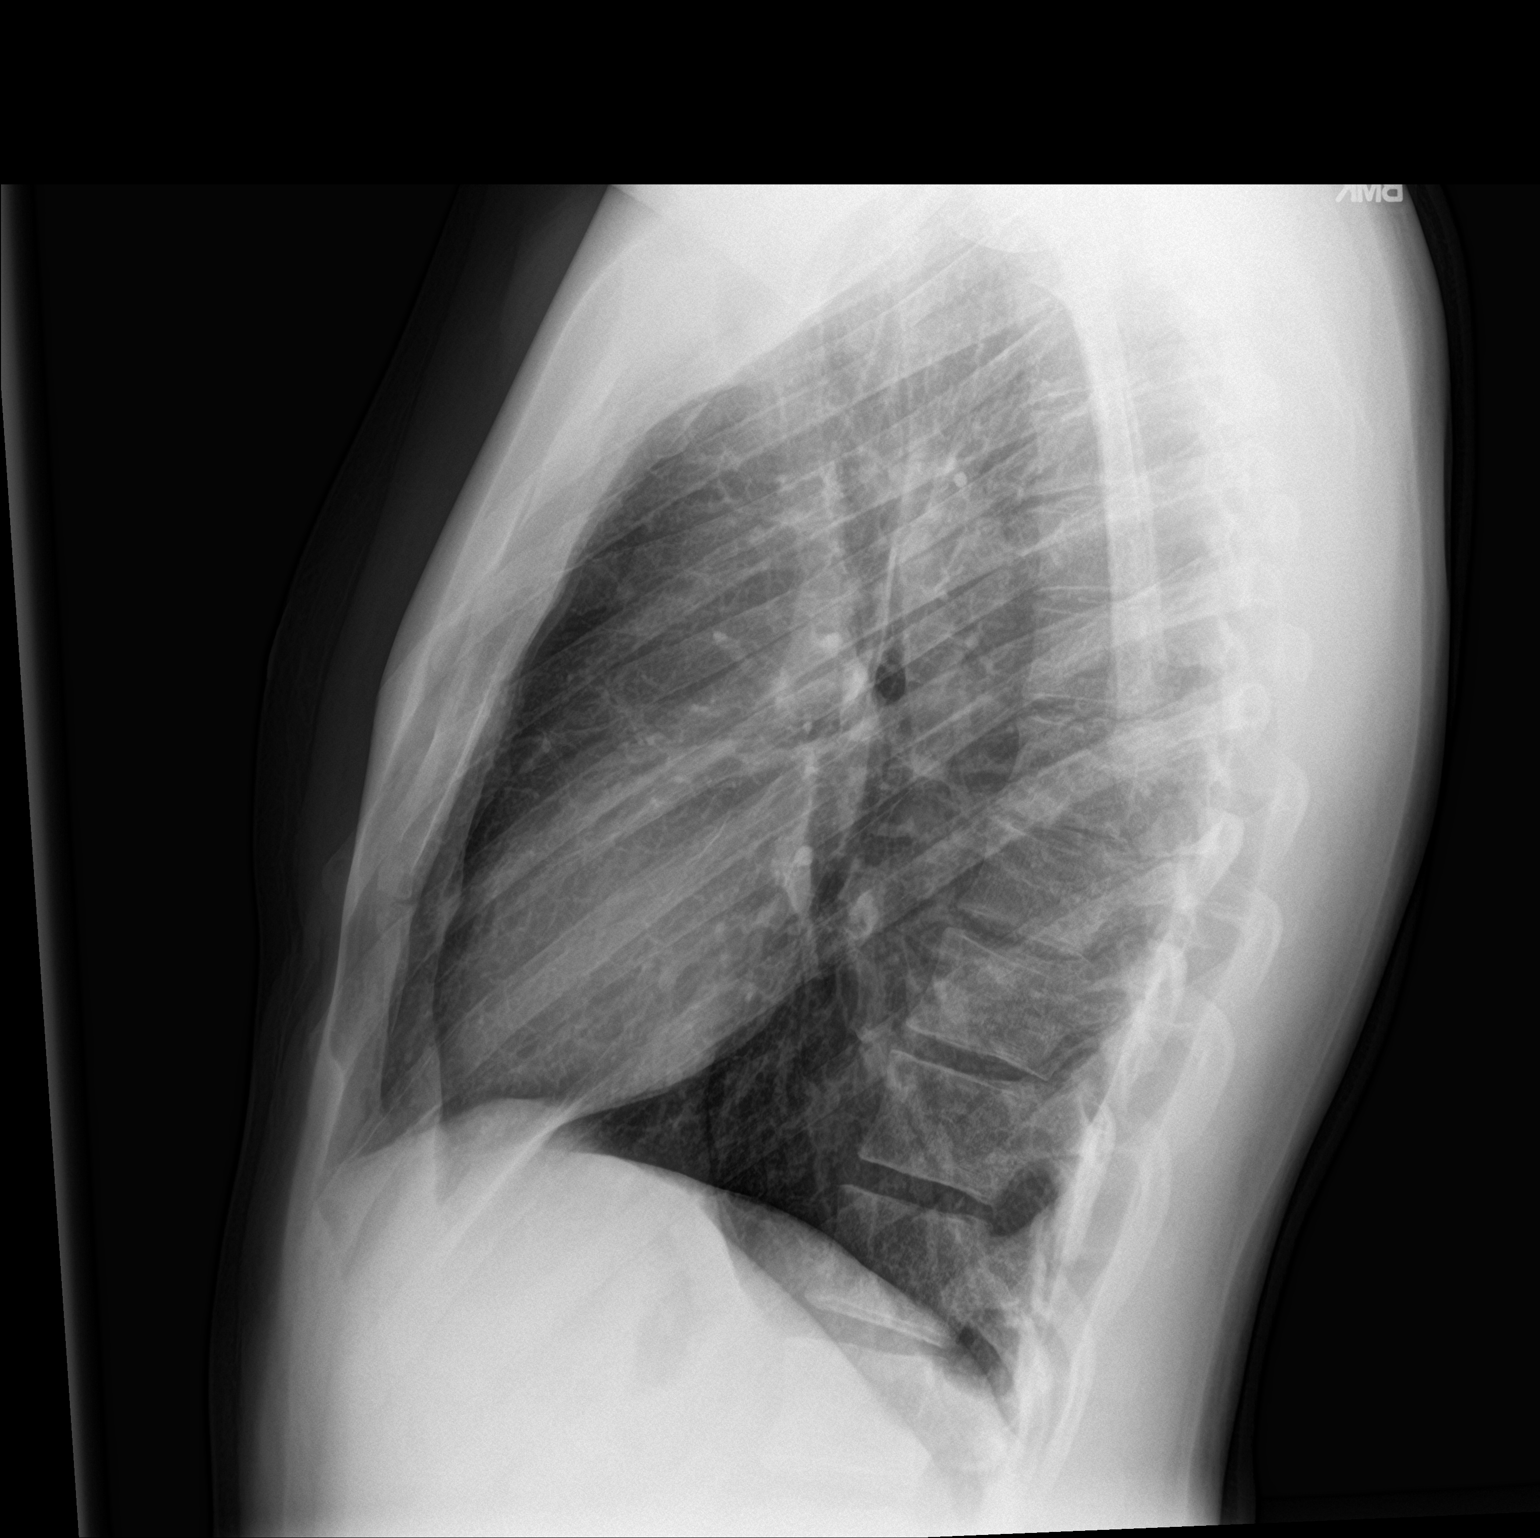

[2 of 2 positions shown; findings below may reference images not displayed]

FINDINGS: Hyperinflation. Mild central airways thickening. No consolidation or
effusion. Normal heart size. No pneumothorax.
IMPRESSION: Hyperinflation with mild bronchitic changes. No acute consolidation.

## 2019-03-10 IMAGING — US US EXTREM LOW*R* LIMITED
1 series · 14 of 25 positions shown · non-contrast
Comparison: None.

CLINICAL DATA: Right groin pain and mass for 1 week

EXAM:
ULTRASOUND RIGHT LOWER EXTREMITY LIMITED
TECHNIQUE: Ultrasound examination of the lower extremity soft tissues was
performed in the area of clinical concern.

[Series 1: us extrem low*right* limited · 14 of 30 slices shown]
[im 1/30]
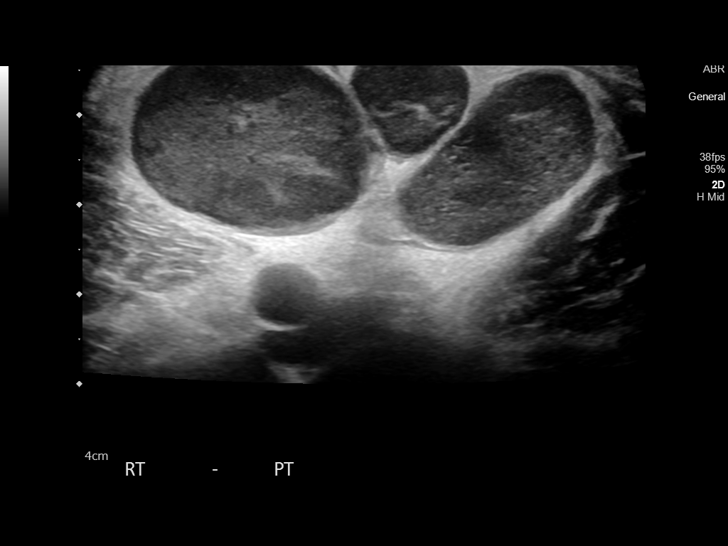
[im 3/30]
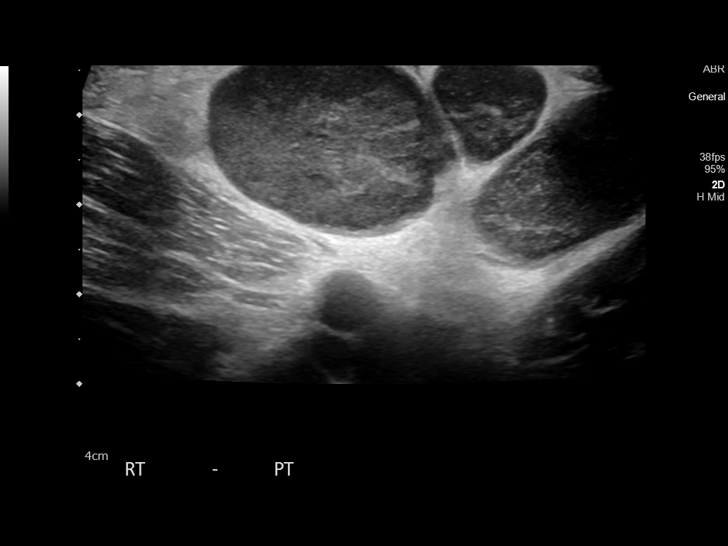
[im 5/30]
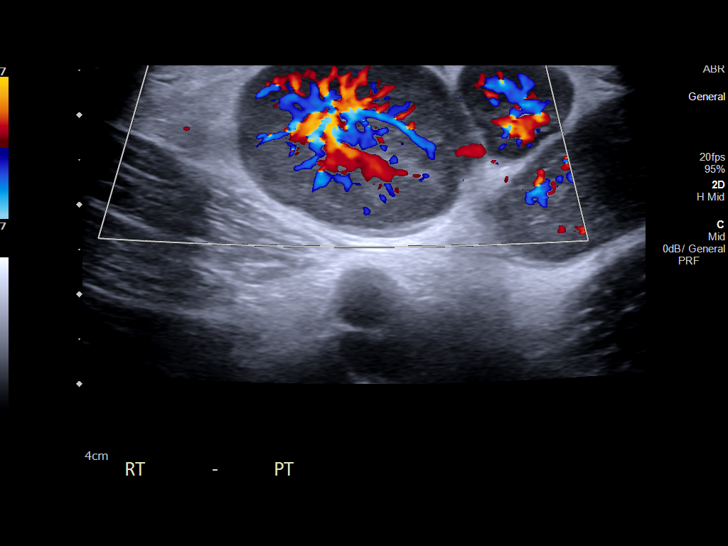
[im 8/30]
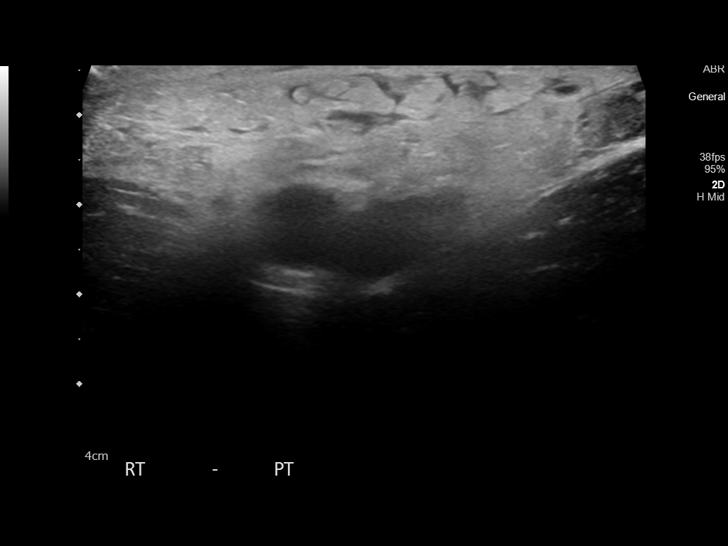
[im 10/30]
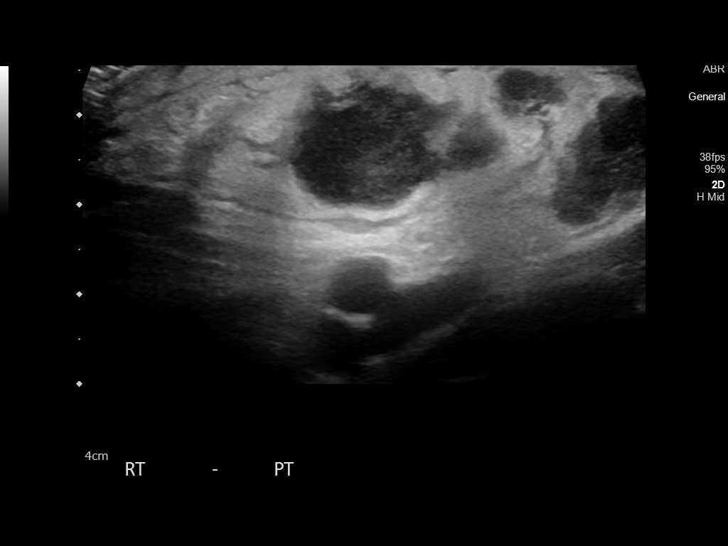
[im 11/30]
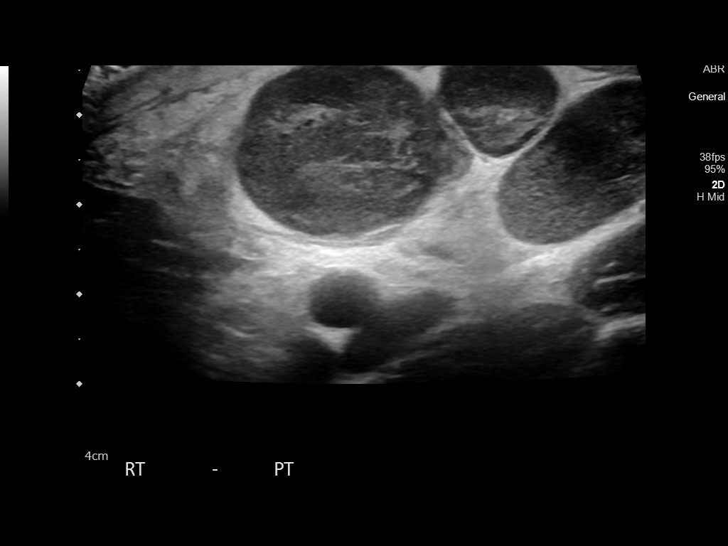
[im 14/30]
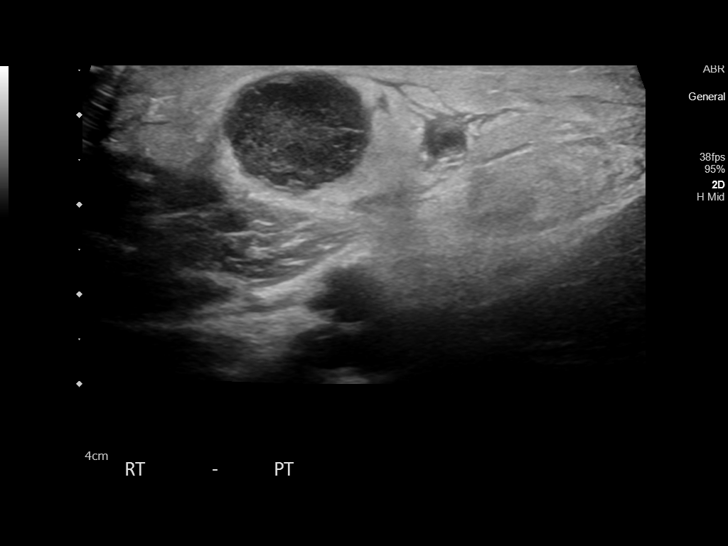
[im 16/30]
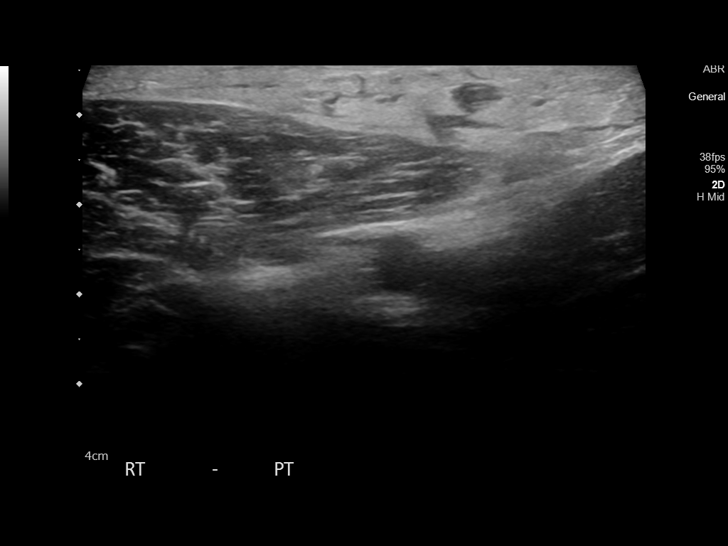
[im 19/30]
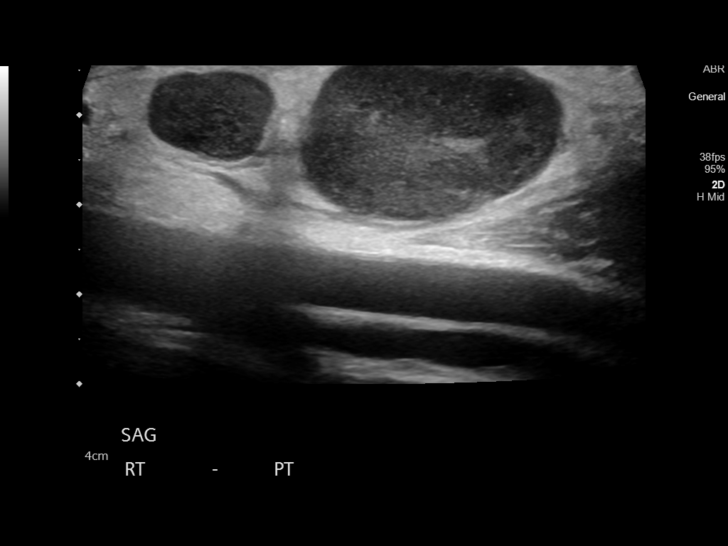
[im 20/30]
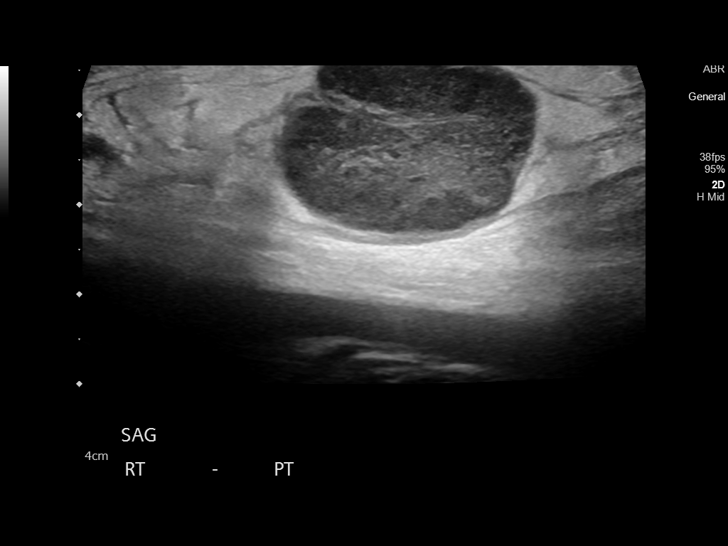
[im 22/30]
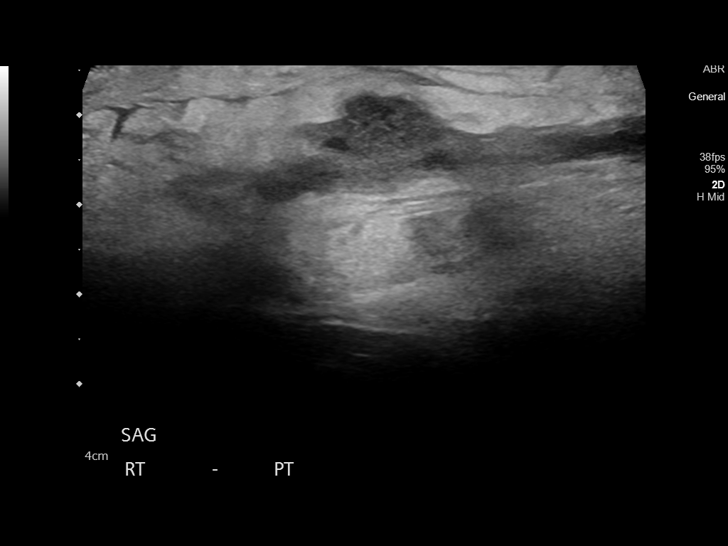
[im 25/30]
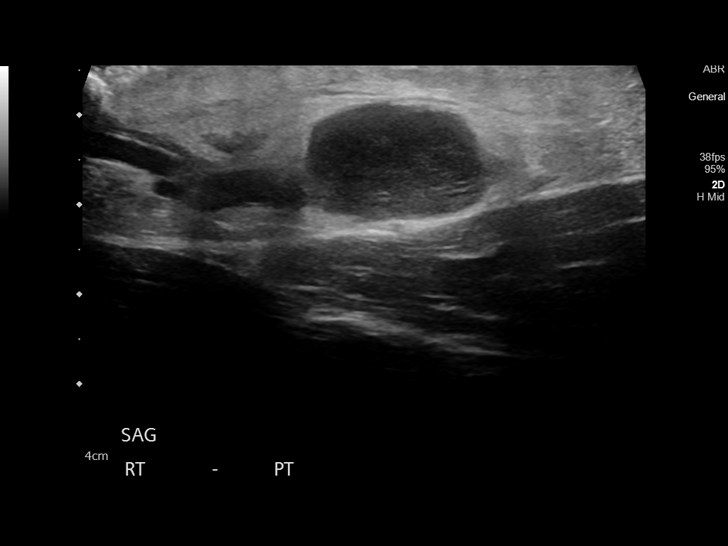
[im 27/30]
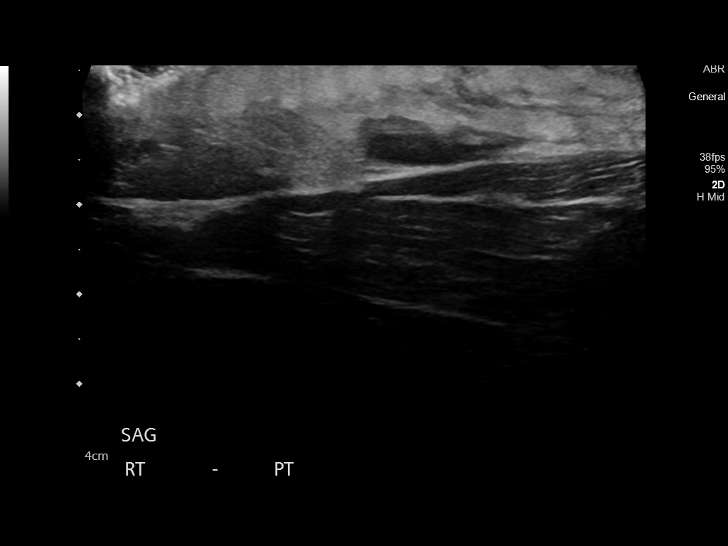
[im 30/30]
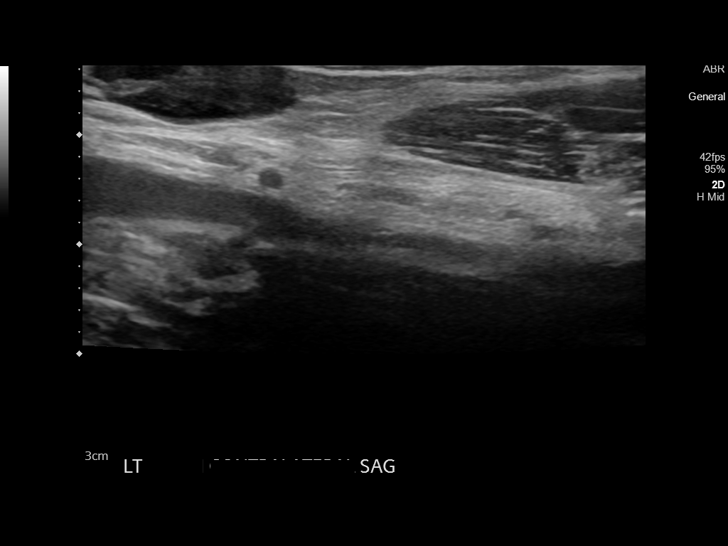

[14 of 25 positions shown; findings below may reference images not displayed]

FINDINGS: Right groin innsonation shows enlarged lymph nodes (up to 3 cm) with
homogeneous echotexture and regional fat edema and increased
echogenicity. No collection or cavitation is seen. Contralateral
groin is unremarkable.
IMPRESSION: Right groin adenopathy likely reflecting adenitis given the acute
clinical history and regional cellulitic appearance. Recommend
clinical follow-up to normalization.

## 2023-04-07 ENCOUNTER — Other Ambulatory Visit: Payer: Self-pay

## 2023-04-07 ENCOUNTER — Ambulatory Visit
Admission: EM | Admit: 2023-04-07 | Discharge: 2023-04-07 | Disposition: A | Discharge status: Home / Self Care | Payer: Medicaid Other | Attending: Nurse Practitioner | Admitting: Nurse Practitioner

## 2023-04-07 ENCOUNTER — Ambulatory Visit: Payer: Medicaid Other

## 2023-04-07 DIAGNOSIS — W208XXA Other cause of strike by thrown, projected or falling object, initial encounter: Secondary | ICD-10-CM

## 2023-04-07 DIAGNOSIS — S4991XA Unspecified injury of right shoulder and upper arm, initial encounter: Secondary | ICD-10-CM | POA: Diagnosis not present

## 2023-04-07 DIAGNOSIS — S41111A Laceration without foreign body of right upper arm, initial encounter: Secondary | ICD-10-CM

## 2023-04-07 DIAGNOSIS — M79621 Pain in right upper arm: Secondary | ICD-10-CM | POA: Diagnosis not present

## 2023-04-07 MED ORDER — AMOXICILLIN-POT CLAVULANATE 875-125 MG PO TABS: 1.0000 | ORAL_TABLET | Freq: Two times a day (BID) | ORAL | 0 refills | Status: AC

## 2023-04-07 NOTE — Discharge Instructions (Addendum)
The x-ray was negative for fracture or dislocation.  There appears to be air trapped in the tissue on the backside of your arm which most likely is causing pain. 3 sutures were placed to right upper arm.  Keep the dressing in place for 24 hours. Take the medication as prescribed. Wear the sling as needed.  Remove the dressing and clean the area with warm water in 24 hours.  You may also cleanse the affected area with an antibacterial soap such as Dial gold bar soap twice daily.  When you are at home, may leave the area open to air.  When you are out, keep the area covered. May apply Neosporin to the lacerations as needed. May take over-the-counter ibuprofen or Tylenol for pain or discomfort. Apply ice to the affected area to help with pain and swelling.  Apply for 20 minutes, remove for 1 hour, repeat as much as possible. Follow-up immediately if you develop swelling, increased redness that goes into the hand or up the arm, drainage, or if you develop fever, chills, or other concerns. Keep the sutures in place for 7 days.  You will follow-up on 9/25 for suture removal. Follow-up as needed.

## 2023-04-07 NOTE — ED Triage Notes (Signed)
Pt states he was working outside and a tree punctured his right arm.  Open wound noted,bleeding controlled.

## 2023-04-07 NOTE — ED Provider Notes (Addendum)
[facility]    CSN: [id number] Arrival date & time: 04/07/23  1012      History   Chief Complaint Chief Complaint  Patient presents with   Puncture Wound    HPI Mike Juarez is a 32 y.o. male.   The history is provided by the patient.   Patient brought in by his father for complaints of an injury to the right upper arm.  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  Patient has an injury above the bend of the elbow.  Patient states that the area is painful.  He states he does have a bleeding controlled at this time.  Patient also complains of pain in the posterior aspect or tricep area of his arm.  He denies numbness, tingling, decreased range of motion of the elbow or hand.  Patient states that he is right-hand dominant.  Patient states that his last tetanus shot was in December 2023.  Patient states that he is not using Worker's Comp. for this injury.  History reviewed. No pertinent past medical history.  There are no problems to display for this patient.   History reviewed. No pertinent surgical history.     Home Medications    Prior to Admission medications   Medication Sig Start Date End Date Taking? Authorizing Provider  amoxicillin-clavulanate (AUGMENTIN) 875-125 MG tablet Take 1 tablet by mouth every 12 (twelve) hours. 04/07/23  Yes Weber Monnier-Warren, Sadie Haber, NP  benzonatate (TESSALON PERLES) 100 MG capsule Take 1 capsule (100 mg total) 3 (three) times daily as needed by mouth for cough (Take 1-2 per dose). 05/24/17   Menshew, Charlesetta Ivory, PA-C  doxycycline (VIBRA-TABS) 100 MG tablet Take 1 tablet (100 mg total) by mouth 2 (two) times daily. 04/03/18   Minna Antis, MD  fluticasone (FLONASE) 50 MCG/ACT nasal spray Place 2 sprays daily into both nostrils. 05/24/17   Menshew, Charlesetta Ivory, PA-C  predniSONE (STERAPRED UNI-PAK 21 TAB) 10 MG (21) TBPK tablet 6-day taper as directed. 05/24/17   Menshew,  Charlesetta Ivory, PA-C    Family History History reviewed. No pertinent family history.  Social History Social History   Tobacco Use   Smoking status: Never   Smokeless tobacco: Never  Substance Use Topics   Alcohol use: Yes    Comment: occ   Drug use: No     Allergies   Patient has no known allergies.   Review of Systems Review of Systems Per HPI  Physical Exam Triage Vital Signs ED Triage Vitals  Encounter Vitals Group     BP 04/07/23 1053 122/73     Systolic BP Percentile --      Diastolic BP Percentile --      Pulse Rate 04/07/23 1053 (!) 51     Resp 04/07/23 1053 16     Temp 04/07/23 1053 98.2 F (36.8 C)     Temp Source 04/07/23 1053 Oral     SpO2 04/07/23 1053 98 %     Weight --      Height --      Head Circumference --      Peak Flow --      Pain Score 04/07/23 1054 8     Pain Loc --      Pain Education --      Exclude from Growth Chart --    No data found.  Updated Vital Signs BP 122/73 (BP Location: Left Arm)  Pulse (!) 51   Temp 98.2 F (36.8 C) (Oral)   Resp 16   SpO2 98%   Visual Acuity Right Eye Distance:   Left Eye Distance:   Bilateral Distance:    Right Eye Near:   Left Eye Near:    Bilateral Near:     Physical Exam Vitals and nursing note reviewed.  Constitutional:      General: He is not in acute distress.    Appearance: Normal appearance.  HENT:     Head: Normocephalic.  Eyes:     Extraocular Movements: Extraocular movements intact.     Pupils: Pupils are equal, round, and reactive to light.  Pulmonary:     Effort: Pulmonary effort is normal.  Musculoskeletal:     Comments: Laceration noted to the anterior right forearm. Laceration caused by tree branch, edges of laceration are jagged. Exposed adipose tissue and muscle present. No tendon or ligament involvement. Laceration measures approximately 3 cm in diameter.  Skin:    General: Skin is warm and dry.  Neurological:     General: No focal deficit present.      Mental Status: He is alert and oriented to person, place, and time.  Psychiatric:        Mood and Affect: Mood normal.        Behavior: Behavior normal.      UC Treatments / Results  Labs (all labs ordered are listed, but only abnormal results are displayed) Labs Reviewed - No data to display  EKG   Radiology DG Humerus Right  Result Date: 04/07/2023 CLINICAL DATA:  arm injured by tree branch EXAM: RIGHT HUMERUS - 2+ VIEW COMPARISON:  None Available. FINDINGS: No acute fracture or dislocation. Joint spaces and alignment are maintained. No area of erosion or osseous destruction. No unexpected radiopaque foreign body. Multiple foci of air along the mid upper arm; this may be within soft tissues versus trapped by an overlying bandage. Recommend correlation with extent of injury. IMPRESSION: 1. No acute fracture or dislocation. 2. Multiple foci of air along the mid upper arm; this may be within soft tissues versus trapped by an overlying bandage. Recommend correlation with extent of injury. Electronically Signed   By: Meda Klinefelter M.D.   On: 04/07/2023 12:11    Procedures Laceration Repair  Date/Time: 04/07/2023 11:58 AM  Performed by: Abran Cantor, NP Authorized by: Abran Cantor, NP   Consent:    Consent obtained:  Verbal   Consent given by:  Patient   Risks discussed:  Infection, pain, poor cosmetic result, nerve damage and poor wound healing   Alternatives discussed:  Referral and no treatment Universal protocol:    Procedure explained and questions answered to patient or proxy's satisfaction: yes     Patient identity confirmed:  Verbally with patient Anesthesia:    Anesthesia method:  Local infiltration   Local anesthetic:  Lidocaine 2% w/o epi Laceration details:    Location:  Shoulder/arm   Shoulder/arm location:  R upper arm   Length (cm):  3 Pre-procedure details:    Preparation:  Patient was prepped and draped in usual sterile  fashion Exploration:    Limited defect created (wound extended): no     Imaging obtained: x-ray     Wound exploration: wound explored through full range of motion     Wound extent: no foreign bodies/material noted and no muscle damage noted     Contaminated: no   Treatment:    Wound cleansed with: Hibiclens  and sterile water.   Amount of cleaning:  Extensive   Irrigation method:  Tap   Foreign body removal: n/a.   Skin repair:    Repair method:  Sutures   Suture size:  3-0   Suture technique:  Simple interrupted   Number of sutures:  3 Approximation:    Approximation:  Loose Repair type:    Repair type:  Simple Post-procedure details:    Dressing:  Antibiotic ointment and non-adherent dressing   Procedure completion:  Tolerated Comments:     Patient with an approximate 3 cm laceration to the anterior aspect of the right upper arm.  Area was closed with 3 simple interrupted sutures using 3.0 nylon.  Patient tolerated the procedure well.  Antibiotic ointment, nonadherent gauze, and Coban dressing was applied.  (including critical care time)  Medications Ordered in UC Medications - No data to display  Initial Impression / Assessment and Plan / UC Course  I have reviewed the triage vital signs and the nursing notes.  Pertinent labs & imaging results that were available during my care of the patient were reviewed by me and considered in my medical decision making (see chart for details).  The patient is well-appearing, he is in no acute distress, vital signs are stable.  Injury to right upper arm caused by a tree branch.  3 simple interrupted sutures placed to the laceration for closure of the site.  Last Tdap in December 2023 x-ray of the right upper arm does not disclose any fracture or dislocation.  X-ray also shows possible air within the tissue of the right upper arm, consistent with pain. Will treat empirically with Augmentin 875/125mg  tablets for possible infection and  involvement of tissue extending down to the muscle.  Sling was also provided for comfort and support.  Patient was advised consistently to discontinue use when symptoms improve.  Supportive care recommendations were provided and discussed with the patient to include use of over-the-counter analgesics, applying ice to the affected area, and cleansing the affected area.  Patient was given strict ER follow-up precautions.  Patient will follow-up in this clinic in 7 days for suture removal.  Patient is in agreement with this plan of care and verbalizes understanding.  All questions were answered.  Patient stable for discharge.  Work note was provided.  Final Clinical Impressions(s) / UC Diagnoses   Final diagnoses:  Laceration of right upper arm, initial encounter  Injury of right upper extremity, initial encounter  Accidentally struck by tree, initial encounter     Discharge Instructions      The x-ray was negative for fracture or dislocation.  There appears to be air trapped in the tissue on the backside of your arm which most likely is causing pain. 3 sutures were placed to right upper arm.  Keep the dressing in place for 24 hours. Take the medication as prescribed. Remove the dressing and clean the area with warm water in 24 hours.  You may also cleanse the affected area with an antibacterial soap such as Dial gold bar soap twice daily.  When you are at home, may leave the area open to air.  When you are out, keep the area covered. May apply Neosporin to the lacerations as needed. May take over-the-counter ibuprofen or Tylenol for pain or discomfort. Apply ice to the affected area to help with pain and swelling.  Apply for 20 minutes, remove for 1 hour, repeat as much as possible. Follow-up immediately if you develop swelling, increased  redness that goes into the hand or up the arm, drainage, or if you develop fever, chills, or other concerns. Keep the sutures in place for 7 days.  You will  follow-up on 9/25 for suture removal. Follow-up as needed.      ED Prescriptions     Medication Sig Dispense Auth. Provider   amoxicillin-clavulanate (AUGMENTIN) 875-125 MG tablet Take 1 tablet by mouth every 12 (twelve) hours. 10 tablet Marlo Arriola-Warren, Sadie Haber, NP      PDMP not reviewed this encounter.   Abran Cantor, NP 04/07/23 1224    Abran Cantor, NP 04/07/23 1232

## 2023-04-08 ENCOUNTER — Ambulatory Visit
Admission: EM | Admit: 2023-04-08 | Discharge: 2023-04-08 | Disposition: A | Discharge status: Home / Self Care | Payer: Medicaid Other | Attending: Nurse Practitioner | Admitting: Nurse Practitioner

## 2023-04-08 ENCOUNTER — Other Ambulatory Visit: Payer: Self-pay

## 2023-04-08 ENCOUNTER — Encounter: Payer: Self-pay | Admitting: Emergency Medicine

## 2023-04-08 DIAGNOSIS — M79621 Pain in right upper arm: Secondary | ICD-10-CM | POA: Diagnosis not present

## 2023-04-08 DIAGNOSIS — S41111D Laceration without foreign body of right upper arm, subsequent encounter: Secondary | ICD-10-CM

## 2023-04-08 DIAGNOSIS — M7989 Other specified soft tissue disorders: Secondary | ICD-10-CM | POA: Diagnosis not present

## 2023-04-08 MED ORDER — CEFTRIAXONE SODIUM 1 G IJ SOLR
1.0000 g | Freq: Once | INTRAMUSCULAR | Status: AC
  Administered 2023-04-08: 1 g via INTRAMUSCULAR

## 2023-04-08 NOTE — Discharge Instructions (Signed)
You have been given ceftriaxone 1 g.  Start taking the Augmentin on 04/09/2023. RICE therapy, rest, ice, compression, and elevation.  You should be elevating the right upper extremity as much as possible.  Apply ice to help with pain and swelling.  Apply for 20 minutes, remove for 1 hour, repeat is much as possible. Wear the sling while symptoms persist. I would like for you to follow-up with orthopedics on 04/09/2023.  Please go to [facility] in [place]: [address], [address] [place], [place] [address] [phone]  If you experience fever, chills, increased pain or swelling, or other concerns, please go to the emergency department immediately. Follow-up as needed.

## 2023-04-08 NOTE — ED Provider Notes (Signed)
[facility]    CSN: [id number] Arrival date & time: 04/08/23  1630      History   Chief Complaint No chief complaint on file.   HPI Mike Juarez is a 32 y.o. male.   The history is provided by the patient.   Patient presents for follow-up after he was seen 1 day ago for an injury to the right upper arm.  Patient had a tree branch punctured the right arm.  3 sutures were placed, and patient was discharged with prescription for Augmentin.  X-ray of the right upper arm was also performed which was negative for fracture or dislocation.  Patient was also provided a sling, which she states he has not utilized much since his discharge.  Patient reports today complaining of increased pain and swelling to the right upper arm.  Pain is localized to the suture line into the tricep area of the right arm.  Patient states that he has difficulty bending the arm.  Chart notes that there was exposure of the muscle when he presented initially for the injury.  Patient denies fever, chills, chest pain, abdominal pain, nausea, vomiting, diarrhea, pain to the elbow or forearm, or numbness or tingling in the right hand.  Patient reports that he did not start the antibiotic until earlier today.  He also reports that he has not iced or elevated the right upper extremity.  History reviewed. No pertinent past medical history.  There are no problems to display for this patient.   History reviewed. No pertinent surgical history.     Home Medications    Prior to Admission medications   Medication Sig Start Date End Date Taking? Authorizing Provider  amoxicillin-clavulanate (AUGMENTIN) 875-125 MG tablet Take 1 tablet by mouth every 12 (twelve) hours. 04/07/23   Alfons Sulkowski-Warren, Sadie Haber, NP    Family History History reviewed. No pertinent family history.  Social History Social History   Tobacco Use   Smoking status: Never   Smokeless tobacco: Never  Substance Use Topics   Alcohol use:  Yes    Comment: occ   Drug use: No     Allergies   Patient has no known allergies.   Review of Systems Review of Systems Per HPI  Physical Exam Triage Vital Signs ED Triage Vitals  Encounter Vitals Group     BP 04/08/23 1638 117/81     Systolic BP Percentile --      Diastolic BP Percentile --      Pulse Rate 04/08/23 1638 86     Resp 04/08/23 1638 18     Temp 04/08/23 1638 99 F (37.2 C)     Temp Source 04/08/23 1638 Oral     SpO2 04/08/23 1638 97 %     Weight --      Height --      Head Circumference --      Peak Flow --      Pain Score 04/08/23 1640 8     Pain Loc --      Pain Education --      Exclude from Growth Chart --    No data found.  Updated Vital Signs BP 117/81 (BP Location: Right Arm)   Pulse 86   Temp 99 F (37.2 C) (Oral)   Resp 18   SpO2 97%   Visual Acuity Right Eye Distance:   Left Eye Distance:   Bilateral Distance:    Right Eye Near:   Left Eye Near:  Bilateral Near:     Physical Exam Vitals and nursing note reviewed.  Constitutional:      General: He is not in acute distress.    Appearance: Normal appearance.  HENT:     Head: Normocephalic.  Eyes:     Extraocular Movements: Extraocular movements intact.     Pupils: Pupils are equal, round, and reactive to light.  Cardiovascular:     Rate and Rhythm: Normal rate and regular rhythm.     Pulses: Normal pulses.     Heart sounds: Normal heart sounds.  Pulmonary:     Effort: Pulmonary effort is normal.  Abdominal:     General: Bowel sounds are normal.     Palpations: Abdomen is soft.  Musculoskeletal:     Right upper arm: Swelling, laceration and tenderness (Tricep region and around the suture line) present. No edema or deformity.     Cervical back: Normal range of motion.  Skin:    General: Skin is warm and dry.     Findings: Erythema and laceration present.     Comments: Laceration to right upper arm. Sutures intact. No drainage present. Erythema noted to the medial  aspect of the upper right arm. Area is warm and tender to palpation.Tenderness noted to the right tricep region.  Neurological:     General: No focal deficit present.     Mental Status: He is alert and oriented to person, place, and time.  Psychiatric:        Mood and Affect: Mood normal.        Behavior: Behavior normal.      UC Treatments / Results  Labs (all labs ordered are listed, but only abnormal results are displayed) Labs Reviewed - No data to display  EKG   Radiology  Procedures Procedures (including critical care time)  Medications Ordered in UC Medications  cefTRIAXone (ROCEPHIN) injection 1 g (1 g Intramuscular Given 04/08/23 1711)    Initial Impression / Assessment and Plan / UC Course  I have reviewed the triage vital signs and the nursing notes.  Pertinent labs & imaging results that were available during my care of the patient were reviewed by me and considered in my medical decision making (see chart for details).  The patient is well-appearing, he is in no acute distress, vital signs are stable.  Suture line is intact.  Patient continues to experience tenderness and pain in the tricep region of the right upper arm.  He also has tenderness at the suture line.  Upon initial presentation, there was exposure of the muscle in the right upper arm.  Cannot rule out injury to the muscle or tendon.  Ceftriaxone 1 g administered for infection prophylaxis.  Supportive care recommendations were provided and discussed with the patient to include the application of ice, elevating the right upper extremity, and continuing over-the-counter pain medication.  Patient was advised he will need to follow-up with orthopedics on 9/20.  Advised patient to go to [facility] urgent care in [place] for further evaluation.  Patient is in agreement with this plan of care and verbalizes understanding.  All questions were answered.  Patient is stable for discharge.  Final Clinical  Impressions(s) / UC Diagnoses   Final diagnoses:  Pain in right upper arm  Swelling of right upper extremity  Laceration of right upper arm, subsequent encounter     Discharge Instructions      You have been given ceftriaxone 1 g.  Start taking the Augmentin on 04/09/2023. RICE therapy,  rest, ice, compression, and elevation.  You should be elevating the right upper extremity as much as possible.  Apply ice to help with pain and swelling.  Apply for 20 minutes, remove for 1 hour, repeat is much as possible. Wear the sling while symptoms persist. I would like for you to follow-up with orthopedics on 04/09/2023.  Please go to [facility] in [place]: [address] [place], [place] [address] [phone]  If you experience fever, chills, increased pain or swelling, or other concerns, please go to the emergency department immediately. Follow-up as needed.     ED Prescriptions   None    PDMP not reviewed this encounter.   Abran Cantor, NP 04/08/23 1719

## 2023-04-08 NOTE — ED Triage Notes (Signed)
Seen here yesterday for right arm injury.  States upper arm has been swelling.  Hurts to straighten arm or bend.
